# Patient Record
Sex: Female | Born: 1955 | Race: White | Hispanic: No | Marital: Married | Smoking: Former smoker
Health system: Southern US, Community
[De-identification: ages and names within clinical notes are randomized; demographics above are authoritative.]

## PROBLEM LIST (undated history)

## (undated) DIAGNOSIS — N950 Postmenopausal bleeding: Principal | ICD-10-CM

## (undated) DIAGNOSIS — K648 Other hemorrhoids: Secondary | ICD-10-CM

## (undated) DIAGNOSIS — K625 Hemorrhage of anus and rectum: Secondary | ICD-10-CM

## (undated) DIAGNOSIS — Z79890 Hormone replacement therapy (postmenopausal): Secondary | ICD-10-CM

## (undated) DIAGNOSIS — E785 Hyperlipidemia, unspecified: Secondary | ICD-10-CM

## (undated) DIAGNOSIS — F329 Major depressive disorder, single episode, unspecified: Secondary | ICD-10-CM

## (undated) DIAGNOSIS — L739 Follicular disorder, unspecified: Secondary | ICD-10-CM

## (undated) DIAGNOSIS — D126 Benign neoplasm of colon, unspecified: Secondary | ICD-10-CM

## (undated) DIAGNOSIS — F32A Depression, unspecified: Secondary | ICD-10-CM

## (undated) DIAGNOSIS — K589 Irritable bowel syndrome without diarrhea: Secondary | ICD-10-CM

## (undated) HISTORY — DX: Depression, unspecified: F32.A

## (undated) HISTORY — DX: Major depressive disorder, single episode, unspecified: F32.9

## (undated) HISTORY — DX: Follicular disorder, unspecified: L73.9

## (undated) HISTORY — DX: Other hemorrhoids: K64.8

## (undated) HISTORY — PX: HEMORRHOID BANDING: SHX5850

## (undated) HISTORY — DX: Hemorrhage of anus and rectum: K62.5

## (undated) HISTORY — DX: Irritable bowel syndrome, unspecified: K58.9

## (undated) HISTORY — DX: Postmenopausal bleeding: N95.0

## (undated) HISTORY — DX: Benign neoplasm of colon, unspecified: D12.6

## (undated) HISTORY — DX: Hormone replacement therapy: Z79.890

## (undated) HISTORY — PX: EYE SURGERY: SHX253

## (undated) HISTORY — PX: REFRACTIVE SURGERY: SHX103

## (undated) HISTORY — PX: WISDOM TOOTH EXTRACTION: SHX21

## (undated) HISTORY — DX: Hyperlipidemia, unspecified: E78.5

---

## 1991-01-18 HISTORY — PX: TUBAL LIGATION: SHX77

## 2000-09-14 ENCOUNTER — Ambulatory Visit (HOSPITAL_COMMUNITY): Admission: RE | Admit: 2000-09-14 | Discharge: 2000-09-14 | Payer: Self-pay | Admitting: Obstetrics and Gynecology

## 2000-09-14 ENCOUNTER — Encounter: Payer: Self-pay | Admitting: Obstetrics and Gynecology

## 2000-11-07 ENCOUNTER — Other Ambulatory Visit: Admission: RE | Admit: 2000-11-07 | Discharge: 2000-11-07 | Payer: Self-pay | Admitting: Obstetrics and Gynecology

## 2002-02-18 ENCOUNTER — Other Ambulatory Visit: Admission: RE | Admit: 2002-02-18 | Discharge: 2002-02-18 | Payer: Self-pay | Admitting: Obstetrics and Gynecology

## 2002-02-21 ENCOUNTER — Ambulatory Visit (HOSPITAL_COMMUNITY): Admission: RE | Admit: 2002-02-21 | Discharge: 2002-02-21 | Payer: Self-pay | Admitting: Obstetrics and Gynecology

## 2002-02-21 ENCOUNTER — Encounter: Payer: Self-pay | Admitting: Obstetrics and Gynecology

## 2003-04-14 ENCOUNTER — Ambulatory Visit (HOSPITAL_COMMUNITY): Admission: RE | Admit: 2003-04-14 | Discharge: 2003-04-14 | Payer: Self-pay | Admitting: Obstetrics and Gynecology

## 2006-08-10 ENCOUNTER — Ambulatory Visit (HOSPITAL_COMMUNITY): Admission: RE | Admit: 2006-08-10 | Discharge: 2006-08-10 | Payer: Self-pay | Admitting: Obstetrics and Gynecology

## 2006-09-27 ENCOUNTER — Other Ambulatory Visit: Admission: RE | Admit: 2006-09-27 | Discharge: 2006-09-27 | Payer: Self-pay | Admitting: Obstetrics and Gynecology

## 2006-09-28 ENCOUNTER — Ambulatory Visit (HOSPITAL_COMMUNITY): Admission: RE | Admit: 2006-09-28 | Discharge: 2006-09-28 | Payer: Self-pay | Admitting: Obstetrics and Gynecology

## 2006-10-10 ENCOUNTER — Ambulatory Visit (HOSPITAL_COMMUNITY): Admission: RE | Admit: 2006-10-10 | Discharge: 2006-10-10 | Payer: Self-pay | Admitting: Family Medicine

## 2007-08-30 ENCOUNTER — Ambulatory Visit (HOSPITAL_COMMUNITY): Admission: RE | Admit: 2007-08-30 | Discharge: 2007-08-30 | Payer: Self-pay | Admitting: Obstetrics and Gynecology

## 2007-10-19 ENCOUNTER — Other Ambulatory Visit: Admission: RE | Admit: 2007-10-19 | Discharge: 2007-10-19 | Payer: Self-pay | Admitting: Obstetrics and Gynecology

## 2008-07-08 IMAGING — CT CT CHEST W/ CM
1 series · 16 of 33 positions shown, 20 images · IV contrast (Omnipaque 300)
Comparison: none

HISTORY: Abnormal chest x-ray

[Series 2: chestroutine 5.0 b40f · axial · 0.67mm/px · z∈[+690,+976]mm · 16 of 63 slices shown, 20 images]
[im 3/63  mediastinal]
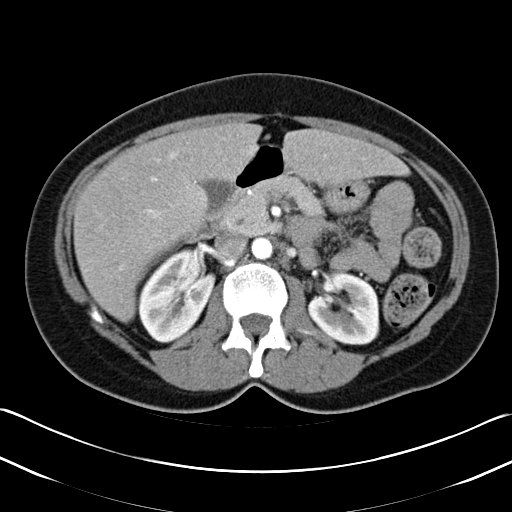
[im 3/63  lung]
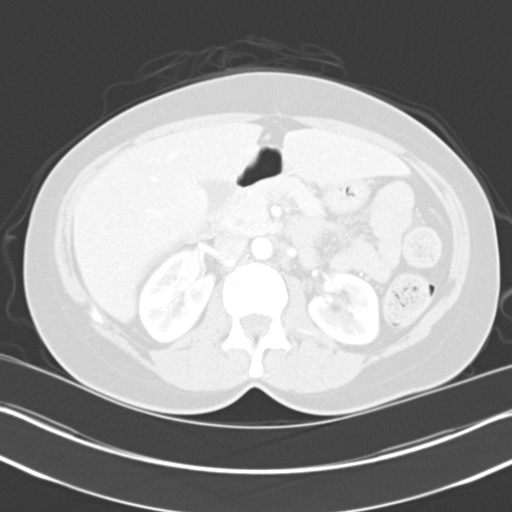
[im 7/63  lung]
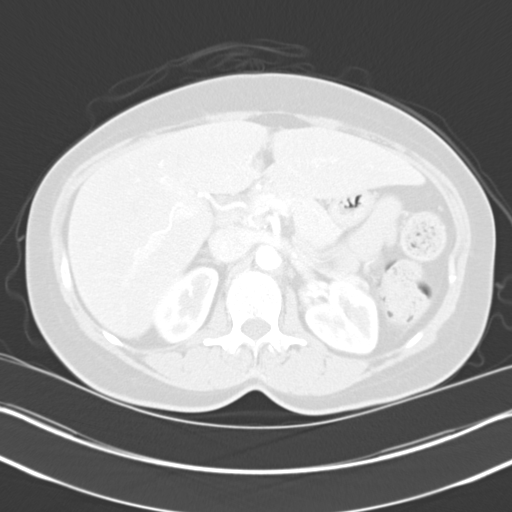
[im 12/63  lung]
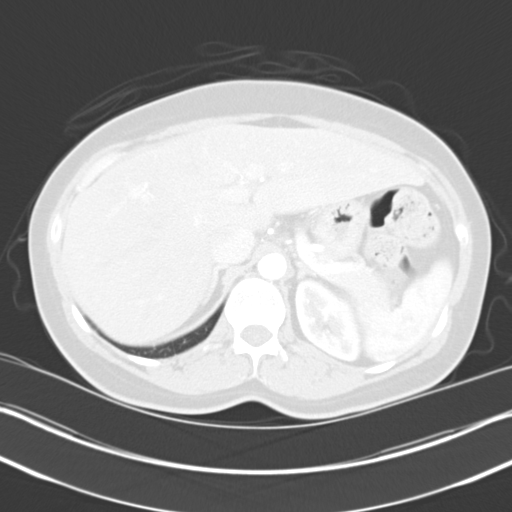
[im 14/63  lung]
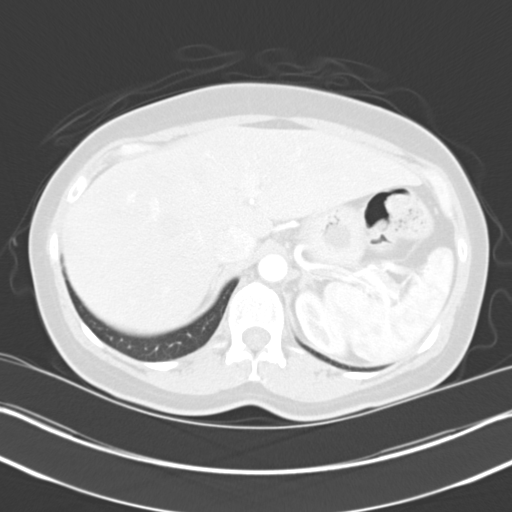
[im 19/63  mediastinal]
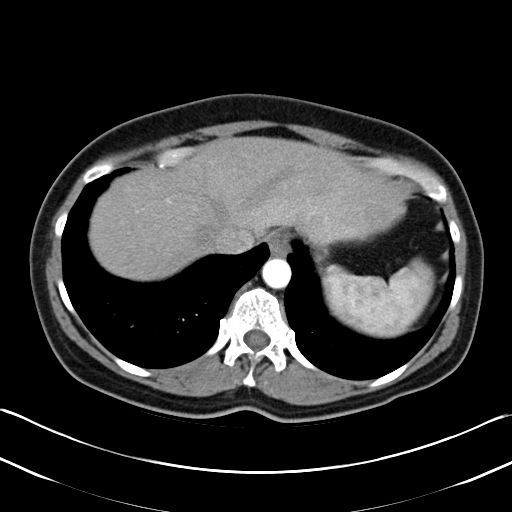
[im 19/63  lung]
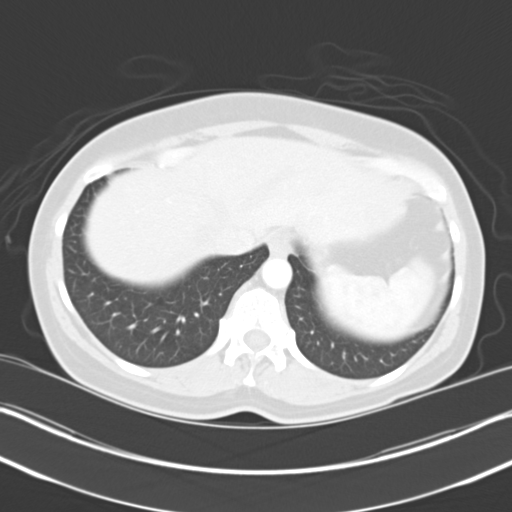
[im 23/63  lung]
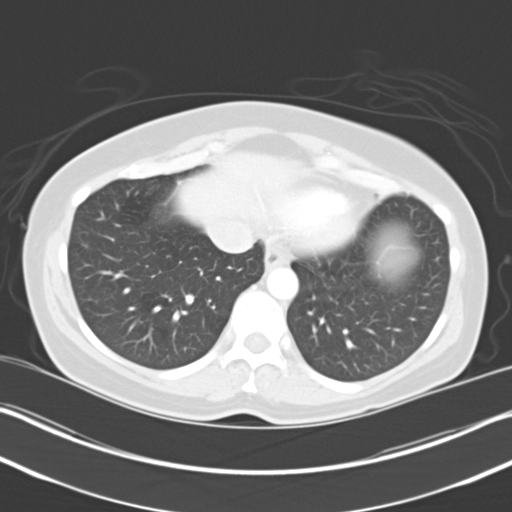
[im 26/63  lung]
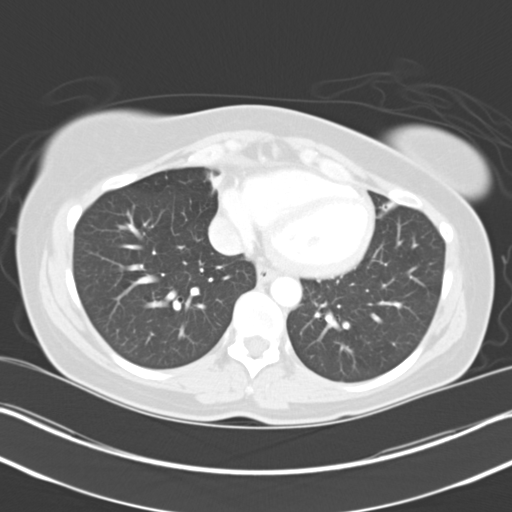
[im 30/63  lung]
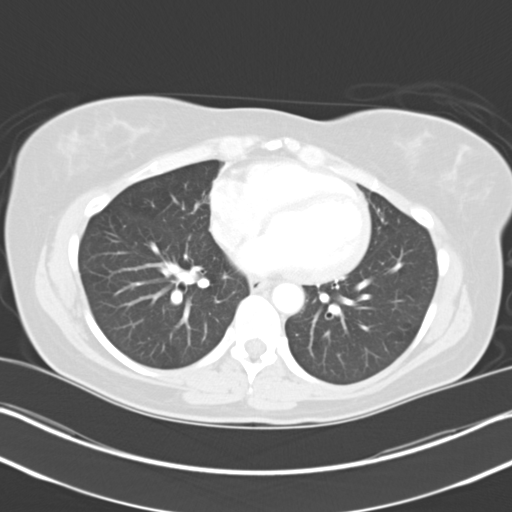
[im 34/63  mediastinal]
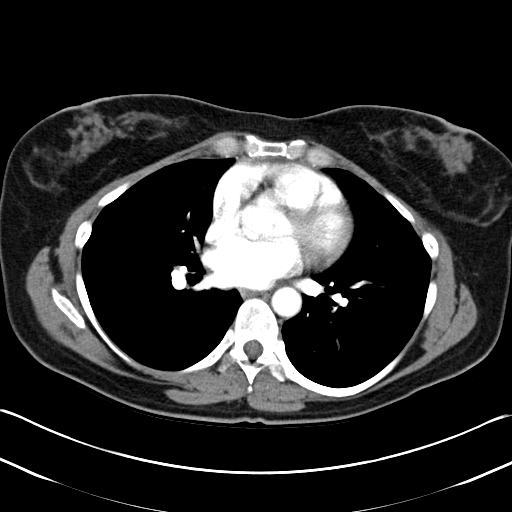
[im 34/63  lung]
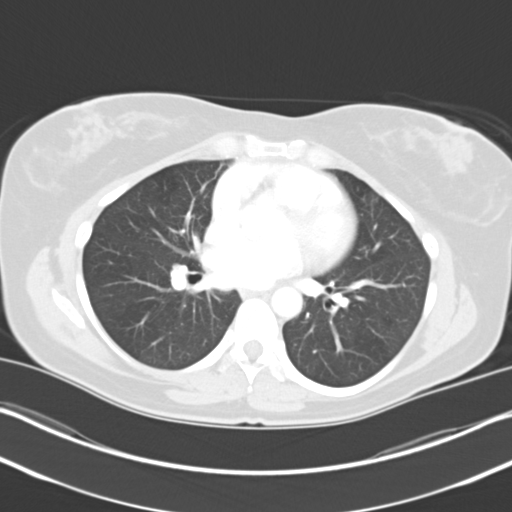
[im 37/63  lung]
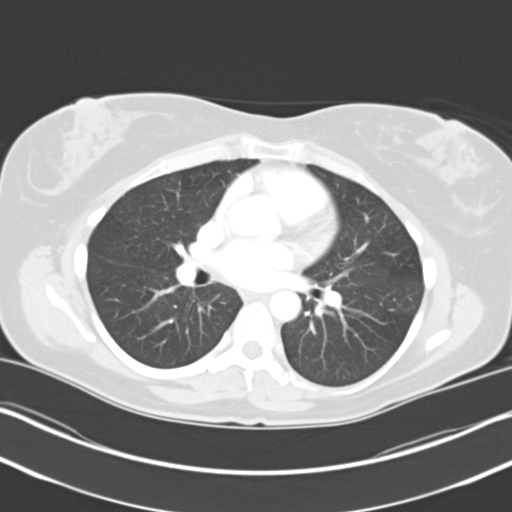
[im 40/63  lung]
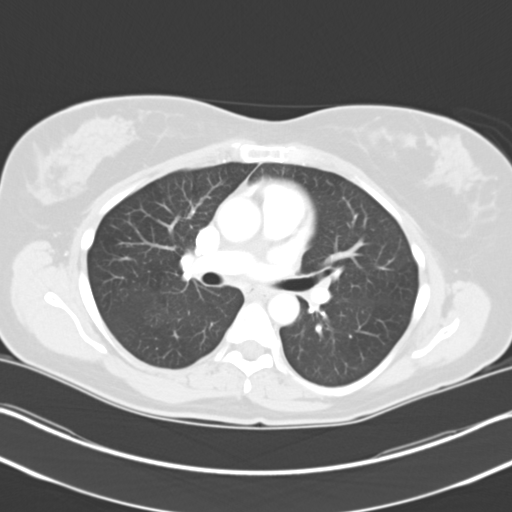
[im 44/63  lung]
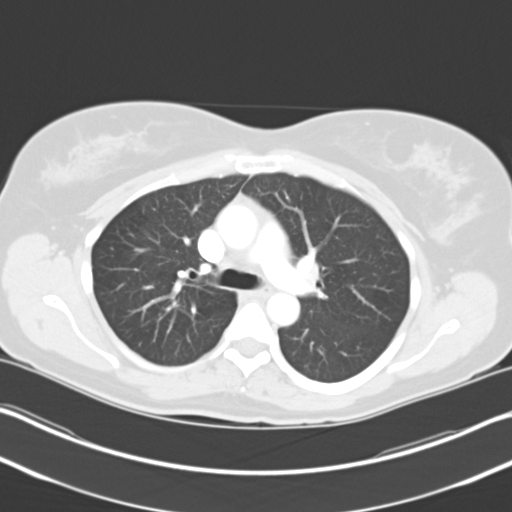
[im 49/63  mediastinal]
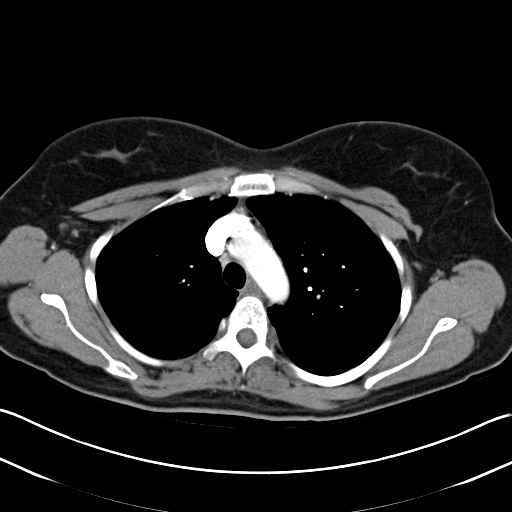
[im 49/63  lung]
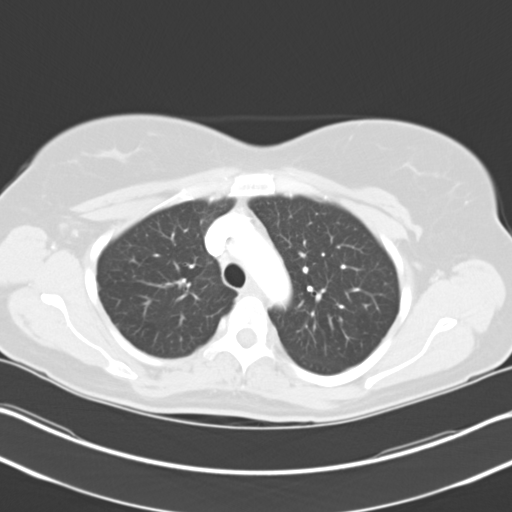
[im 51/63  lung]
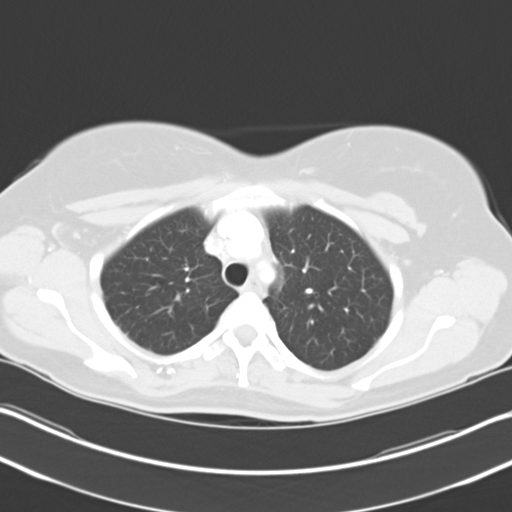
[im 56/63  lung]
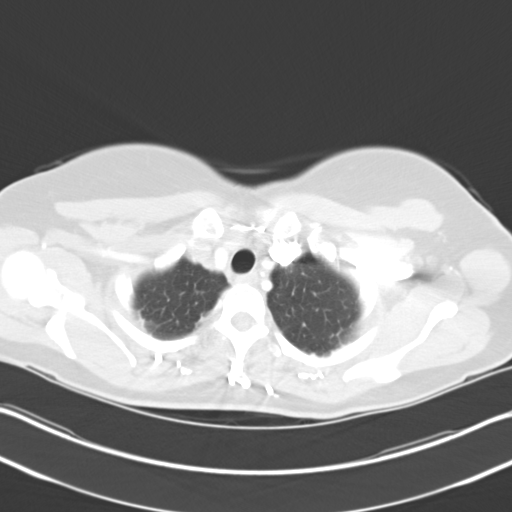
[im 60/63  lung]
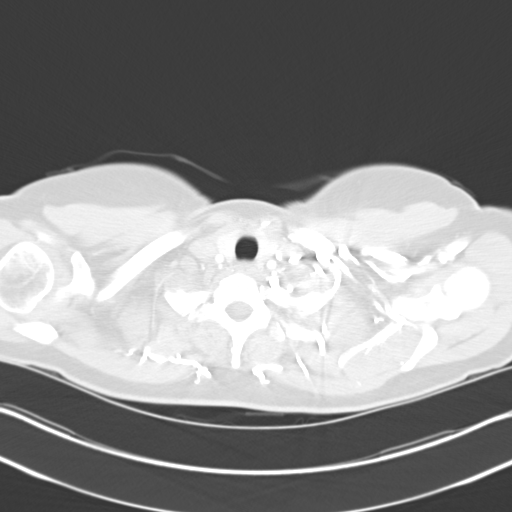

[16 of 33 positions shown; findings below may reference images not displayed]

CT CHEST WITH CONTRAST:

Multidetector helical CT chest performed following 80 cc Smnipaque-CMM.
Sagittal and coronal images reconstructed from axial data set.
Correlation made with priors chest x-ray of 09/28/2006.

Thoracic vascular structures patent.
No thoracic adenopathy.
Visualized portion of upper abdomen unremarkable.
Tiny calcified granuloma in right mid lung, accounting for chest x-ray
abnormality, lesion 3 mm diameter.
No other pulmonary nodule, infiltrate, mass, or effusion.
No bone abnormalities.
IMPRESSION: Tiny calcified granuloma right midlung, accounting for chest x-ray finding.
No acute thoracic abnormalities.

## 2008-09-29 ENCOUNTER — Ambulatory Visit (HOSPITAL_COMMUNITY): Admission: RE | Admit: 2008-09-29 | Discharge: 2008-09-29 | Payer: Self-pay | Admitting: Obstetrics & Gynecology

## 2008-11-10 ENCOUNTER — Other Ambulatory Visit: Admission: RE | Admit: 2008-11-10 | Discharge: 2008-11-10 | Payer: Self-pay | Admitting: Obstetrics and Gynecology

## 2009-10-06 ENCOUNTER — Ambulatory Visit (HOSPITAL_COMMUNITY): Admission: RE | Admit: 2009-10-06 | Discharge: 2009-10-06 | Payer: Self-pay | Admitting: Obstetrics and Gynecology

## 2010-02-07 ENCOUNTER — Encounter: Payer: Self-pay | Admitting: Obstetrics and Gynecology

## 2010-09-24 ENCOUNTER — Other Ambulatory Visit: Payer: Self-pay | Admitting: Adult Health

## 2010-09-24 DIAGNOSIS — Z139 Encounter for screening, unspecified: Secondary | ICD-10-CM

## 2010-10-26 ENCOUNTER — Ambulatory Visit (HOSPITAL_COMMUNITY)
Admission: RE | Admit: 2010-10-26 | Discharge: 2010-10-26 | Disposition: A | Discharge status: Home / Self Care | Payer: PRIVATE HEALTH INSURANCE | Source: Ambulatory Visit | Attending: Adult Health | Admitting: Adult Health

## 2010-10-26 DIAGNOSIS — Z139 Encounter for screening, unspecified: Secondary | ICD-10-CM

## 2010-10-26 DIAGNOSIS — Z1231 Encounter for screening mammogram for malignant neoplasm of breast: Secondary | ICD-10-CM | POA: Insufficient documentation

## 2010-12-14 ENCOUNTER — Other Ambulatory Visit (HOSPITAL_COMMUNITY)
Admission: RE | Admit: 2010-12-14 | Discharge: 2010-12-14 | Disposition: A | Discharge status: Home / Self Care | Payer: PRIVATE HEALTH INSURANCE | Source: Ambulatory Visit | Attending: Obstetrics and Gynecology | Admitting: Obstetrics and Gynecology

## 2010-12-14 ENCOUNTER — Other Ambulatory Visit: Payer: Self-pay | Admitting: Adult Health

## 2010-12-14 DIAGNOSIS — Z01419 Encounter for gynecological examination (general) (routine) without abnormal findings: Secondary | ICD-10-CM | POA: Insufficient documentation

## 2011-09-22 ENCOUNTER — Encounter: Payer: Self-pay | Admitting: Internal Medicine

## 2011-10-18 ENCOUNTER — Other Ambulatory Visit: Payer: Self-pay | Admitting: Adult Health

## 2011-10-18 DIAGNOSIS — Z139 Encounter for screening, unspecified: Secondary | ICD-10-CM

## 2011-10-21 ENCOUNTER — Encounter: Payer: Self-pay | Admitting: Internal Medicine

## 2011-10-21 ENCOUNTER — Ambulatory Visit (AMBULATORY_SURGERY_CENTER): Payer: BC Managed Care – PPO | Admitting: *Deleted

## 2011-10-21 VITALS — Ht 60.0 in | Wt 128.0 lb

## 2011-10-21 DIAGNOSIS — Z1211 Encounter for screening for malignant neoplasm of colon: Secondary | ICD-10-CM

## 2011-10-21 MED ORDER — MOVIPREP 100 G PO SOLR: ORAL | Status: DC

## 2011-11-03 ENCOUNTER — Ambulatory Visit (HOSPITAL_COMMUNITY)
Admission: RE | Admit: 2011-11-03 | Discharge: 2011-11-03 | Disposition: A | Discharge status: Home / Self Care | Payer: BC Managed Care – PPO | Source: Ambulatory Visit | Attending: Adult Health | Admitting: Adult Health

## 2011-11-03 DIAGNOSIS — Z139 Encounter for screening, unspecified: Secondary | ICD-10-CM

## 2011-11-03 DIAGNOSIS — Z1231 Encounter for screening mammogram for malignant neoplasm of breast: Secondary | ICD-10-CM | POA: Insufficient documentation

## 2011-11-04 ENCOUNTER — Ambulatory Visit (AMBULATORY_SURGERY_CENTER): Payer: BC Managed Care – PPO | Admitting: Internal Medicine

## 2011-11-04 ENCOUNTER — Encounter: Payer: Self-pay | Admitting: Internal Medicine

## 2011-11-04 VITALS — BP 111/57 | HR 68 | Temp 98.0°F | Resp 20 | Ht 60.0 in | Wt 128.0 lb

## 2011-11-04 DIAGNOSIS — Z1211 Encounter for screening for malignant neoplasm of colon: Secondary | ICD-10-CM

## 2011-11-04 MED ORDER — SODIUM CHLORIDE 0.9 % IV SOLN: 500.0000 mL | INTRAVENOUS | Status: DC

## 2011-11-04 MED ORDER — HYDROCORTISONE ACETATE 25 MG RE SUPP: 25.0000 mg | Freq: Every evening | RECTAL | Status: DC | PRN

## 2011-11-04 NOTE — Op Note (Signed)
[facility] [address] [place], [address]   COLONOSCOPY PROCEDURE REPORT  PATIENT: Kristen Willis, Kristen Willis  MR#: [id number] BIRTHDATE: 09-05-1955 , 55  yrs. old GENDER: Female ENDOSCOPIST: Hart Carwin, MD REFERRED BY:  Karleen Hampshire, M.D. PROCEDURE DATE:  11/04/2011 PROCEDURE:   Colonoscopy, screening ASA CLASS:   Class I INDICATIONS:average risk patient for colon cancer. MEDICATIONS: MAC sedation, administered by CRNA and Propofol (Diprivan) 200 mg IV  DESCRIPTION OF PROCEDURE:   After the risks and benefits and of the procedure were explained, informed consent was obtained.  A digital rectal exam revealed no abnormalities of the rectum.    The LB PCF-H180AL [id number]  endoscope was introduced through the anus and advanced to the cecum, which was identified by both the appendix and ileocecal valve .  The quality of the prep was excellent, using MoviPrep .  The instrument was then slowly withdrawn as the colon was fully examined.     COLON FINDINGS: Moderate sized internal hemorrhoids were found. Retroflexed views revealed no abnormalities.     The scope was then withdrawn from the patient and the procedure completed.  COMPLICATIONS: There were no complications. ENDOSCOPIC IMPRESSION: Moderate sized internal hemorrhoids Anusol HC supp  RECOMMENDATIONS: High fiber diet   REPEAT EXAM: In 10 year(s)  for Colonoscopy.  cc:  _______________________________ eSignedHart Carwin, MD 11/04/2011 11:35 AM

## 2011-11-04 NOTE — Progress Notes (Signed)
Patient did not experience any of the following events: a burn prior to discharge; a fall within the facility; wrong site/side/patient/procedure/implant event; or a hospital transfer or hospital admission upon discharge from the facility. (G8907) Patient did not have preoperative order for IV antibiotic SSI prophylaxis. (G8918)  

## 2011-11-04 NOTE — Patient Instructions (Addendum)

## 2011-11-07 ENCOUNTER — Telehealth: Payer: Self-pay

## 2011-11-07 NOTE — Telephone Encounter (Signed)
Left message

## 2011-11-07 NOTE — Telephone Encounter (Signed)
  Follow up Call-  Call back number 11/04/2011  Post procedure Call Back phone  # [phone]  Permission to leave phone message Yes     Patient questions:  Do you have a fever, pain , or abdominal swelling? no Pain Score  0 *  Have you tolerated food without any problems? yes  Have you been able to return to your normal activities? yes  Do you have any questions about your discharge instructions: Diet   no Medications  no Follow up visit  no  Do you have questions or concerns about your Care? no  Actions: * If pain score is 4 or above: No action needed, pain <4.

## 2011-12-19 ENCOUNTER — Other Ambulatory Visit: Payer: Self-pay | Admitting: Adult Health

## 2011-12-19 ENCOUNTER — Other Ambulatory Visit (HOSPITAL_COMMUNITY)
Admission: RE | Admit: 2011-12-19 | Discharge: 2011-12-19 | Disposition: A | Discharge status: Home / Self Care | Payer: BC Managed Care – PPO | Source: Ambulatory Visit | Attending: Obstetrics and Gynecology | Admitting: Obstetrics and Gynecology

## 2011-12-19 DIAGNOSIS — Z01419 Encounter for gynecological examination (general) (routine) without abnormal findings: Secondary | ICD-10-CM | POA: Insufficient documentation

## 2011-12-19 DIAGNOSIS — Z1151 Encounter for screening for human papillomavirus (HPV): Secondary | ICD-10-CM | POA: Insufficient documentation

## 2012-06-22 ENCOUNTER — Other Ambulatory Visit: Payer: Self-pay | Admitting: Adult Health

## 2012-10-05 ENCOUNTER — Other Ambulatory Visit: Payer: Self-pay | Admitting: Adult Health

## 2012-10-05 DIAGNOSIS — Z139 Encounter for screening, unspecified: Secondary | ICD-10-CM

## 2012-11-08 ENCOUNTER — Ambulatory Visit (HOSPITAL_COMMUNITY)
Admission: RE | Admit: 2012-11-08 | Discharge: 2012-11-08 | Disposition: A | Discharge status: Home / Self Care | Payer: BC Managed Care – PPO | Source: Ambulatory Visit | Attending: Adult Health | Admitting: Adult Health

## 2012-11-08 DIAGNOSIS — Z1231 Encounter for screening mammogram for malignant neoplasm of breast: Secondary | ICD-10-CM | POA: Insufficient documentation

## 2012-11-08 DIAGNOSIS — Z139 Encounter for screening, unspecified: Secondary | ICD-10-CM

## 2012-12-31 ENCOUNTER — Ambulatory Visit (INDEPENDENT_AMBULATORY_CARE_PROVIDER_SITE_OTHER): Payer: BC Managed Care – PPO | Admitting: Adult Health

## 2012-12-31 ENCOUNTER — Encounter: Payer: Self-pay | Admitting: Adult Health

## 2012-12-31 VITALS — BP 120/78 | HR 72 | Ht 60.0 in | Wt 133.0 lb

## 2012-12-31 DIAGNOSIS — Z1212 Encounter for screening for malignant neoplasm of rectum: Secondary | ICD-10-CM

## 2012-12-31 DIAGNOSIS — F329 Major depressive disorder, single episode, unspecified: Secondary | ICD-10-CM

## 2012-12-31 DIAGNOSIS — Z79890 Hormone replacement therapy (postmenopausal): Secondary | ICD-10-CM | POA: Insufficient documentation

## 2012-12-31 DIAGNOSIS — Z01419 Encounter for gynecological examination (general) (routine) without abnormal findings: Secondary | ICD-10-CM

## 2012-12-31 DIAGNOSIS — M79605 Pain in left leg: Secondary | ICD-10-CM | POA: Insufficient documentation

## 2012-12-31 HISTORY — DX: Hormone replacement therapy: Z79.890

## 2012-12-31 LAB — HEMOCCULT GUIAC POC 1CARD (OFFICE)

## 2012-12-31 MED ORDER — ESTRADIOL-NORETHINDRONE ACET 0.05-0.14 MG/DAY TD PTTW: 1.0000 | MEDICATED_PATCH | TRANSDERMAL | Status: DC

## 2012-12-31 NOTE — Patient Instructions (Signed)
Physical in 1 year Mammogram yearly Colonoscopy per GI Labs in near future

## 2012-12-31 NOTE — Progress Notes (Signed)
Patient ID: Kristen Willis, female   DOB: Jan 18, 1955, 57 y.o.   MRN: [id number] History of Present Illness: Lexi is a 57 year old white female, married in for a physical,had a normal pap with negative HPV 12/19/11.   Current Medications, Allergies, Past Medical History, Past Surgical History, Family History and Social History were reviewed in [employer] record.   Past Medical History  Diagnosis Date  . Depression   . Depression   . Irritable bowel syndrome   . Hormone replacement therapy (HRT) 12/31/2012   Past Surgical History  Procedure Laterality Date  . Tubal ligation  1993  . Wisdom tooth extraction      early 20's  Current outpatient prescriptions:buPROPion (WELLBUTRIN XL) 300 MG 24 hr tablet, Take 150 mg by mouth Daily., Disp: , Rfl: ;  Calcium Carb-Cholecalciferol (CALCIUM + D3) 600-200 MG-UNIT TABS, Take 1 tablet by mouth daily., Disp: , Rfl: ;  estradiol-norethindrone (COMBIPATCH) 0.05-0.14 MG/DAY, Place 1 patch onto the skin 2 (two) times a week., Disp: 8 patch, Rfl: prn hydrocortisone (ANUSOL-HC) 25 MG suppository, Place 1 suppository (25 mg total) rectally at bedtime as needed for hemorrhoids., Disp: 12 suppository, Rfl: 1;  Inulin (FIBER-SURE PO), Take by mouth as directed., Disp: , Rfl: ;  medroxyPROGESTERone (PROVERA) 10 MG tablet, TAKE ONE TABLET DAILY., Disp: 30 tablet, Rfl: 6;  vitamin C (ASCORBIC ACID) 500 MG tablet, Take 500 mg by mouth daily., Disp: , Rfl:  vitamin E 400 UNIT capsule, Take 400 Units by mouth daily., Disp: , Rfl: ;  RASPBERRY PO, Take 1 tablet by mouth daily., Disp: , Rfl:   Review of Systems: Patient denies any headaches, blurred vision, shortness of breath, chest pain, abdominal pain, problems with bowel movements, urination, or intercourse. No mood swings, complains of pain left leg, no point tenderness.    Physical Exam:BP 120/78  Pulse 72  Ht 5' (1.524 m)  Wt 133 lb (60.328 kg)  BMI 25.97 kg/m2  LMP  09/21/2011 General:  Well developed, well nourished, no acute distress Skin:  Warm and dry Neck:  Midline trachea, normal thyroid Lungs; Clear to auscultation bilaterally Breast:  No dominant palpable mass, retraction, or nipple discharge Cardiovascular: Regular rate and rhythm Abdomen:  Soft, non tender, no hepatosplenomegaly Pelvic:  External genitalia is normal in appearance.  The vagina is normal in appearance. The cervix is smooth.  Uterus is felt to be normal size, shape, and contour.  No  adnexal masses or tenderness noted. Rectal: Good sphincter tone, no polyps, or hemorrhoids felt.  Hemoccult negative. Extremities:  No swelling or varicosities noted, no point tenderness Psych:  No mood changes, alert and cooperative,seems happy   Impression: Yearly well woman exam no pap HRT Left leg pain Depression     Plan: Physical in 1 year  Mammogram yearly  Colonoscopy per GI Labs in near future Refilled climara patch x 1 year See Dr Romeo Apple

## 2013-01-28 ENCOUNTER — Telehealth: Payer: Self-pay | Admitting: Adult Health

## 2013-01-28 NOTE — Telephone Encounter (Signed)
Having rectal bleeding x 3,  had colonoscopy 2013 Dr Olevia Perches, will make appt to be checked

## 2013-01-30 ENCOUNTER — Encounter: Payer: Self-pay | Admitting: Adult Health

## 2013-01-30 ENCOUNTER — Ambulatory Visit (INDEPENDENT_AMBULATORY_CARE_PROVIDER_SITE_OTHER): Payer: 59 | Admitting: Adult Health

## 2013-01-30 ENCOUNTER — Encounter (INDEPENDENT_AMBULATORY_CARE_PROVIDER_SITE_OTHER): Payer: Self-pay

## 2013-01-30 VITALS — BP 122/74 | Ht 60.0 in | Wt 137.0 lb

## 2013-01-30 DIAGNOSIS — K625 Hemorrhage of anus and rectum: Secondary | ICD-10-CM

## 2013-01-30 DIAGNOSIS — K649 Unspecified hemorrhoids: Secondary | ICD-10-CM | POA: Insufficient documentation

## 2013-01-30 HISTORY — DX: Hemorrhage of anus and rectum: K62.5

## 2013-01-30 LAB — HEMOCCULT GUIAC POC 1CARD (OFFICE): Fecal Occult Blood, POC: NEGATIVE

## 2013-01-30 MED ORDER — HYDROCORTISONE ACETATE 25 MG RE SUPP: RECTAL | Status: DC

## 2013-01-30 NOTE — Patient Instructions (Signed)
Rectal Bleeding Rectal bleeding is when blood passes out of the anus. It is usually a sign that something is wrong. It may not be serious, but it should always be evaluated. Rectal bleeding may present as bright red blood or extremely dark stools. The color may range from dark red or maroon to black (like tar). It is important that the cause of rectal bleeding be identified so treatment can be started and the problem corrected. CAUSES   Hemorrhoids. These are enlarged (dilated) blood vessels or veins in the anal or rectal area.  Fistulas. Theseare abnormal, burrowing channels that usually run from inside the rectum to the skin around the anus. They can bleed.  Anal fissures. This is a tear in the tissue of the anus. Bleeding occurs with bowel movements.  Diverticulosis. This is a condition in which pockets or sacs project from the bowel wall. Occasionally, the sacs can bleed.  Diverticulitis. Thisis an infection involving diverticulosis of the colon.  Proctitis and colitis. These are conditions in which the rectum, colon, or both, can become inflamed and pitted (ulcerated).  Polyps and cancer. Polyps are non-cancerous (benign) growths in the colon that may bleed. Certain types of polyps turn into cancer.  Protrusion of the rectum. Part of the rectum can project from the anus and bleed.  Certain medicines.  Intestinal infections.  Blood vessel abnormalities. HOME CARE INSTRUCTIONS  Eat a high-fiber diet to keep your stool soft.  Limit activity.  Drink enough fluids to keep your urine clear or pale yellow.  Warm baths may be useful to soothe rectal pain.  Follow up with your caregiver as directed. SEEK IMMEDIATE MEDICAL CARE IF:  You develop increased bleeding.  You have black or dark red stools.  You vomit blood or material that looks like coffee grounds.  You have abdominal pain or tenderness.  You have a fever.  You feel weak, nauseous, or you faint.  You have  severe rectal pain or you are unable to have a bowel movement. MAKE SURE YOU:  Understand these instructions.  Will watch your condition.  Will get help right away if you are not doing well or get worse. Document Released: 06/25/2001 Document Revised: 03/28/2011 Document Reviewed: 06/20/2010 [facility] Patient Information 2014 [place], [place]. Hemorrhoids Hemorrhoids are swollen veins around the rectum or anus. There are two types of hemorrhoids:   Internal hemorrhoids. These occur in the veins just inside the rectum. They may poke through to the outside and become irritated and painful.  External hemorrhoids. These occur in the veins outside the anus and can be felt as a painful swelling or hard lump near the anus. CAUSES  Pregnancy.   Obesity.   Constipation or diarrhea.   Straining to have a bowel movement.   Sitting for long periods on the toilet.  Heavy lifting or other activity that caused you to strain.  Anal intercourse. SYMPTOMS   Pain.   Anal itching or irritation.   Rectal bleeding.   Fecal leakage.   Anal swelling.   One or more lumps around the anus.  DIAGNOSIS  Your caregiver may be able to diagnose hemorrhoids by visual examination. Other examinations or tests that may be performed include:   Examination of the rectal area with a gloved hand (digital rectal exam).   Examination of anal canal using a small tube (scope).   A blood test if you have lost a significant amount of blood.  A test to look inside the colon (sigmoidoscopy or colonoscopy). TREATMENT  Most hemorrhoids can be treated at home. However, if symptoms do not seem to be getting better or if you have a lot of rectal bleeding, your caregiver may perform a procedure to help make the hemorrhoids get smaller or remove them completely. Possible treatments include:   Placing a rubber band at the base of the hemorrhoid to cut off the circulation (rubber band ligation).    Injecting a chemical to shrink the hemorrhoid (sclerotherapy).   Using a tool to burn the hemorrhoid (infrared light therapy).   Surgically removing the hemorrhoid (hemorrhoidectomy).   Stapling the hemorrhoid to block blood flow to the tissue (hemorrhoid stapling).  HOME CARE INSTRUCTIONS   Eat foods with fiber, such as whole grains, beans, nuts, fruits, and vegetables. Ask your doctor about taking products with added fiber in them (fibersupplements).  Increase fluid intake. Drink enough water and fluids to keep your urine clear or pale yellow.   Exercise regularly.   Go to the bathroom when you have the urge to have a bowel movement. Do not wait.   Avoid straining to have bowel movements.   Keep the anal area dry and clean. Use wet toilet paper or moist towelettes after a bowel movement.   Medicated creams and suppositories may be used or applied as directed.   Only take over-the-counter or prescription medicines as directed by your caregiver.   Take warm sitz baths for 15 20 minutes, 3 4 times a day to ease pain and discomfort.   Place ice packs on the hemorrhoids if they are tender and swollen. Using ice packs between sitz baths may be helpful.   Put ice in a plastic bag.   Place a towel between your skin and the bag.   Leave the ice on for 15 20 minutes, 3 4 times a day.   Do not use a donut-shaped pillow or sit on the toilet for long periods. This increases blood pooling and pain.  SEEK MEDICAL CARE IF:  You have increasing pain and swelling that is not controlled by treatment or medicine.  You have uncontrolled bleeding.  You have difficulty or you are unable to have a bowel movement.  You have pain or inflammation outside the area of the hemorrhoids. MAKE SURE YOU:  Understand these instructions.  Will watch your condition.  Will get help right away if you are not doing well or get worse. Document Released: 01/01/2000 Document  Revised: 12/21/2011 Document Reviewed: 11/08/2011 [facility] Patient Information 2014 [place]. Use supp  Warm tub increase fiber and water Call prn

## 2013-01-30 NOTE — Progress Notes (Signed)
Subjective:     Patient ID: Kristen Willis, female   DOB: 1955-06-28, 58 y.o.   MRN: [id number]  HPI Kristen Willis is a 58 year old white female in complaining of having 3 episodes of painless rectal bleeding,has had none since Monday, but no BM either.She had a normal colonoscopy in 2013 in [place] with Dr Maryanna Shape.She has known hemorrhoids.  Review of Systems See HPI Reviewed past medical,surgical, social and family history. Reviewed medications and allergies.     Objective:   Physical Exam BP 122/74  Ht 5' (1.524 m)  Wt 137 lb (62.143 kg)  BMI 26.76 kg/m2  LMP 09/21/2011   Skin warm and dry, on rectal exam has small external hemorrhoid and internal one at 5 0'clock and the hemoccult was negative.  Assessment:     Rectal bleeding Hemorrhoids     Plan:     Refilled Anusol HC supp 25 mg use 1 bid for about 5 days then prn #12 with 1 refill Increase fiber and water Review handout on rectal bleeding and hemorrhoids and call me with any more problems or if decides wants to see Dr Oneida Alar for banding or call Dr Maryanna Shape in [place] for follow up

## 2013-02-13 ENCOUNTER — Telehealth: Payer: Self-pay | Admitting: Obstetrics and Gynecology

## 2013-02-13 ENCOUNTER — Encounter: Payer: Self-pay | Admitting: Adult Health

## 2013-02-13 NOTE — Telephone Encounter (Signed)
Pt to call our office back to clarify if we need to do a mail in order or can e-scribe RX to her pharmacy.

## 2013-02-18 ENCOUNTER — Other Ambulatory Visit: Payer: Self-pay | Admitting: Adult Health

## 2013-04-26 NOTE — Telephone Encounter (Signed)
close

## 2013-05-09 ENCOUNTER — Other Ambulatory Visit: Payer: Self-pay | Admitting: *Deleted

## 2013-05-09 MED ORDER — MEDROXYPROGESTERONE ACETATE 10 MG PO TABS: 10.0000 mg | ORAL_TABLET | Freq: Every day | ORAL | Status: DC

## 2013-10-22 ENCOUNTER — Other Ambulatory Visit: Payer: Self-pay | Admitting: Adult Health

## 2013-10-22 DIAGNOSIS — Z139 Encounter for screening, unspecified: Secondary | ICD-10-CM

## 2013-11-11 ENCOUNTER — Ambulatory Visit (HOSPITAL_COMMUNITY)
Admission: RE | Admit: 2013-11-11 | Discharge: 2013-11-11 | Disposition: A | Discharge status: Home / Self Care | Payer: 59 | Source: Ambulatory Visit | Attending: Adult Health | Admitting: Adult Health

## 2013-11-11 DIAGNOSIS — Z1231 Encounter for screening mammogram for malignant neoplasm of breast: Secondary | ICD-10-CM | POA: Insufficient documentation

## 2013-11-11 DIAGNOSIS — Z139 Encounter for screening, unspecified: Secondary | ICD-10-CM

## 2013-11-18 ENCOUNTER — Encounter: Payer: Self-pay | Admitting: Adult Health

## 2014-01-01 ENCOUNTER — Encounter: Payer: Self-pay | Admitting: Adult Health

## 2014-01-01 ENCOUNTER — Ambulatory Visit (INDEPENDENT_AMBULATORY_CARE_PROVIDER_SITE_OTHER): Payer: 59 | Admitting: Adult Health

## 2014-01-01 VITALS — BP 120/72 | HR 76 | Ht 59.5 in | Wt 135.0 lb

## 2014-01-01 DIAGNOSIS — Z79890 Hormone replacement therapy (postmenopausal): Secondary | ICD-10-CM

## 2014-01-01 DIAGNOSIS — Z01419 Encounter for gynecological examination (general) (routine) without abnormal findings: Secondary | ICD-10-CM

## 2014-01-01 MED ORDER — ESTRADIOL-NORETHINDRONE ACET 0.05-0.14 MG/DAY TD PTTW: 1.0000 | MEDICATED_PATCH | TRANSDERMAL | Status: DC

## 2014-01-01 NOTE — Progress Notes (Signed)
Patient ID: Kristen Willis, female   DOB: [birth year], 58 y.o.   MRN: [id number] History of Present Illness: Kristen Willis is a 58 year old white female, married in for gyn exam .She had a normal pap with negative HPV 12/19/11.   Current Medications, Allergies, Past Medical History, Past Surgical History, Family History and Social History were reviewed in [employer] record.     Review of Systems: Patient denies any headaches, blurred vision, shortness of breath, chest pain, abdominal pain, problems with bowel movements, urination, or intercourse. No joint swelling or mood swings, occasional fluid retention.    Physical Exam:BP 120/72 mmHg  Pulse 76  Ht 4' 11.5" (1.511 m)  Wt 135 lb (61.236 kg)  BMI 26.82 kg/m2  LMP 09/21/2011 General:  Well developed, well nourished, no acute distress Skin:  Warm and dry Neck:  Midline trachea, normal thyroid Lungs; Clear to auscultation bilaterally Breast:  No dominant palpable mass, retraction, or nipple discharge Cardiovascular: Regular rate and rhythm Abdomen:  Soft, non tender, no hepatosplenomegaly Pelvic:  External genitalia is normal in appearance.  The vagina is normal in appearance.  The cervix is smooth.  Uterus is felt to be normal size, shape, and contour.  No adnexal masses or tenderness noted. Rectal: Good sphincter tone, no polyps, or hemorrhoids felt.  Hemoccult negative. Extremities:  No swelling or varicosities noted today Psych:  No mood changes,alert and cooperative,seems happy   Impression: Well woman gyn exam no pap HRT    Plan: Refilled Combipatch x 1 year Mammogram yearly  Colonoscopy per GI Labs in near future,CBC,CMP,TSH and lipids Pap and physical in 1 year

## 2014-01-01 NOTE — Patient Instructions (Signed)
Pap and physical in  1 year Mammogram yearly  Labs in near future Colonoscopy per GI

## 2014-02-11 ENCOUNTER — Other Ambulatory Visit: Payer: Self-pay | Admitting: *Deleted

## 2014-02-11 MED ORDER — ESTRADIOL-NORETHINDRONE ACET 0.05-0.14 MG/DAY TD PTTW: 1.0000 | MEDICATED_PATCH | TRANSDERMAL | Status: DC

## 2014-04-04 ENCOUNTER — Other Ambulatory Visit: Payer: Self-pay | Admitting: Adult Health

## 2014-09-26 ENCOUNTER — Other Ambulatory Visit: Payer: Self-pay | Admitting: Adult Health

## 2014-10-22 ENCOUNTER — Telehealth: Payer: Self-pay | Admitting: Adult Health

## 2014-10-22 MED ORDER — SULFAMETHOXAZOLE-TRIMETHOPRIM 800-160 MG PO TABS: 1.0000 | ORAL_TABLET | Freq: Two times a day (BID) | ORAL | Status: DC

## 2014-10-22 NOTE — Telephone Encounter (Signed)
Has spot in groin, is purple and tender,to come in Monday at her request, will RX septra ds 1 bid x 14 days to calm it,has some spotting on HRT too

## 2014-10-27 ENCOUNTER — Ambulatory Visit (INDEPENDENT_AMBULATORY_CARE_PROVIDER_SITE_OTHER): Payer: 59 | Admitting: Adult Health

## 2014-10-27 ENCOUNTER — Encounter: Payer: Self-pay | Admitting: Adult Health

## 2014-10-27 VITALS — BP 116/78 | HR 72 | Ht 60.0 in | Wt 133.0 lb

## 2014-10-27 DIAGNOSIS — Z79890 Hormone replacement therapy (postmenopausal): Secondary | ICD-10-CM | POA: Diagnosis not present

## 2014-10-27 DIAGNOSIS — L739 Follicular disorder, unspecified: Secondary | ICD-10-CM | POA: Diagnosis not present

## 2014-10-27 DIAGNOSIS — N950 Postmenopausal bleeding: Secondary | ICD-10-CM

## 2014-10-27 HISTORY — DX: Postmenopausal bleeding: N95.0

## 2014-10-27 HISTORY — DX: Follicular disorder, unspecified: L73.9

## 2014-10-27 NOTE — Progress Notes (Signed)
Subjective:     Patient ID: Kristen Willis, female   DOB: [birth year], 59 y.o.   MRN: [id number]  HPI Kristen Willis is a 59 year old white female,married in complaining of bleeding some on HRT and has bump on bottom,is better since starting septra ds.  Review of Systems Patient denies any headaches, hearing loss, fatigue, blurred vision, shortness of breath, chest pain, abdominal pain, problems with bowel movements, urination, or intercourse. No joint pain or mood swings.See HPI for positives.  Reviewed past medical,surgical, social and family history. Reviewed medications and allergies.     Objective:   Physical Exam BP 116/78 mmHg  Pulse 72  Ht 5' (1.524 m)  Wt 133 lb (60.328 kg)  BMI 25.97 kg/m2  LMP 09/21/2011 Skin warm and dry.Pelvic: external genitalia is normal in appearance has fading purple area near base right labia,peas sized area of firmness there, vagina: scant discharge without odor,urethra has no lesions or masses noted, cervix:smooth and bulbous, uterus: normal size, shape and contour, non tender, no masses felt, adnexa: no masses or tenderness noted. Bladder is non tender and no masses felt.     Assessment:     PMB Folliculitis HRT    Plan:     Return in 1 week for gyn [place] Finish septra ds Continue HRT

## 2014-10-27 NOTE — Patient Instructions (Signed)
Return in 1 week for [place] Continue septra ds

## 2014-11-03 ENCOUNTER — Ambulatory Visit (INDEPENDENT_AMBULATORY_CARE_PROVIDER_SITE_OTHER): Payer: 59

## 2014-11-03 DIAGNOSIS — N950 Postmenopausal bleeding: Secondary | ICD-10-CM

## 2014-11-03 NOTE — Progress Notes (Signed)
PELVIC US TA/TV: normal retroverted uterus,EEC 2.76mm,normal ov bilat (mobile),no free fluid,no pain during ultrasound

## 2014-11-04 ENCOUNTER — Telehealth: Payer: Self-pay | Admitting: Adult Health

## 2014-11-04 NOTE — Telephone Encounter (Signed)
Pt aware [place] was normal,EEC 2.2 mm

## 2014-11-18 ENCOUNTER — Other Ambulatory Visit: Payer: Self-pay | Admitting: Adult Health

## 2014-11-18 DIAGNOSIS — Z1231 Encounter for screening mammogram for malignant neoplasm of breast: Secondary | ICD-10-CM

## 2014-11-20 ENCOUNTER — Ambulatory Visit (HOSPITAL_COMMUNITY)
Admission: RE | Admit: 2014-11-20 | Discharge: 2014-11-20 | Disposition: A | Discharge status: Home / Self Care | Payer: 59 | Source: Ambulatory Visit | Attending: Adult Health | Admitting: Adult Health

## 2014-11-20 DIAGNOSIS — Z1231 Encounter for screening mammogram for malignant neoplasm of breast: Secondary | ICD-10-CM | POA: Diagnosis not present

## 2015-01-21 ENCOUNTER — Telehealth: Payer: Self-pay | Admitting: Adult Health

## 2015-01-21 MED ORDER — ESTRADIOL-NORETHINDRONE ACET 0.05-0.14 MG/DAY TD PTTW: 1.0000 | MEDICATED_PATCH | TRANSDERMAL | Status: DC

## 2015-01-21 NOTE — Telephone Encounter (Signed)
Will order combi patch at [facility].can't get through mail order any more

## 2015-03-09 ENCOUNTER — Telehealth: Payer: Self-pay | Admitting: Adult Health

## 2015-03-09 NOTE — Telephone Encounter (Signed)
Left message I called 

## 2015-03-09 NOTE — Telephone Encounter (Signed)
Pt called stating that she would like to speak with Jennifer, Pt did not state the reason why. Please contact pt °

## 2015-03-09 NOTE — Telephone Encounter (Signed)
Had some more vaginal bleeding on HRT, had normal [place] in October, will stop HRT and make appt for pap and physical in about 3 weeks

## 2015-04-01 ENCOUNTER — Other Ambulatory Visit (HOSPITAL_COMMUNITY)
Admission: RE | Admit: 2015-04-01 | Discharge: 2015-04-01 | Disposition: A | Discharge status: Home / Self Care | Payer: 59 | Source: Ambulatory Visit | Attending: Adult Health | Admitting: Adult Health

## 2015-04-01 ENCOUNTER — Ambulatory Visit (INDEPENDENT_AMBULATORY_CARE_PROVIDER_SITE_OTHER): Payer: 59 | Admitting: Adult Health

## 2015-04-01 ENCOUNTER — Encounter: Payer: Self-pay | Admitting: Adult Health

## 2015-04-01 VITALS — BP 136/70 | HR 84 | Ht 59.25 in | Wt 133.0 lb

## 2015-04-01 DIAGNOSIS — Z1151 Encounter for screening for human papillomavirus (HPV): Secondary | ICD-10-CM | POA: Diagnosis present

## 2015-04-01 DIAGNOSIS — Z01419 Encounter for gynecological examination (general) (routine) without abnormal findings: Secondary | ICD-10-CM | POA: Insufficient documentation

## 2015-04-01 DIAGNOSIS — Z1212 Encounter for screening for malignant neoplasm of rectum: Secondary | ICD-10-CM | POA: Diagnosis not present

## 2015-04-01 DIAGNOSIS — R3915 Urgency of urination: Secondary | ICD-10-CM

## 2015-04-01 LAB — POCT URINALYSIS DIPSTICK
Glucose, UA: NEGATIVE
LEUKOCYTES UA: NEGATIVE
NITRITE UA: NEGATIVE
PROTEIN UA: NEGATIVE
RBC UA: NEGATIVE

## 2015-04-01 LAB — HEMOCCULT GUIAC POC 1CARD (OFFICE): Fecal Occult Blood, POC: NEGATIVE

## 2015-04-01 NOTE — Progress Notes (Signed)
Patient ID: MAGNOLIA KOWITZ, female   DOB: 12-18-1955, 60 y.o.   MRN: [id number] History of Present Illness: Kristn is a 60 year old white female, married in for well woman gyn exam and pap.She has stopped HRT and has not had any spotting in over a month.She has some urinary urgency at night.Has had a cough for about 3 weeks.   Current Medications, Allergies, Past Medical History, Past Surgical History, Family History and Social History were reviewed in [employer] record.     Review of Systems: Patient denies any headaches, hearing loss, fatigue, blurred vision, shortness of breath, chest pain, abdominal pain, problems with bowel movements, or intercourse. No joint pain or mood swings. See HPI for positives.   Physical Exam:BP 136/70 mmHg  Pulse 84  Ht 4' 11.25" (1.505 m)  Wt 133 lb (60.328 kg)  BMI 26.63 kg/m2  LMP 09/21/2011 urine negtive General:  Well developed, well nourished, no acute distress Skin:  Warm and dry Neck:  Midline trachea, normal thyroid, good ROM, no lymphadenopathy Lungs; Clear to auscultation bilaterally Breast:  No dominant palpable mass, retraction, or nipple discharge Cardiovascular: Regular rate and rhythm Abdomen:  Soft, non tender, no hepatosplenomegaly Pelvic:  External genitalia is normal in appearance, no lesions.  The vagina is normal in appearance. Urethra has no lesions or masses. The cervix is smooth, pap with HPV performed.  Uterus is felt to be normal size, shape, and contour.  No adnexal masses or tenderness noted.Bladder is non tender, no masses felt. Rectal: Good sphincter tone, no polyps, or hemorrhoids felt.  Hemoccult negative. Extremities/musculoskeletal:  No swelling or varicosities noted, no clubbing or cyanosis Psych:  No mood changes, alert and cooperative,seems happy   Impression: Well woman gyn exam and pap Urinary urgency     Plan: Check CBC,CMP,TSH and lipids,A1c and vitamin D in near future, orders  given  Physical in 1 year, pap in 3 if normal Mammogram yearly

## 2015-04-01 NOTE — Patient Instructions (Signed)
Labs soon Physical in 1 year, pap in 3 if normal Mammogram yearly

## 2015-04-03 LAB — CYTOLOGY - PAP

## 2015-05-09 LAB — CBC
HEMATOCRIT: 44.9 % (ref 34.0–46.6)
HEMOGLOBIN: 15.4 g/dL (ref 11.1–15.9)
MCH: 32.4 pg (ref 26.6–33.0)
MCHC: 34.3 g/dL (ref 31.5–35.7)
MCV: 95 fL (ref 79–97)
Platelets: 238 10*3/uL (ref 150–379)
RBC: 4.75 x10E6/uL (ref 3.77–5.28)
RDW: 12.9 % (ref 12.3–15.4)
WBC: 4.8 10*3/uL (ref 3.4–10.8)

## 2015-05-09 LAB — COMPREHENSIVE METABOLIC PANEL
A/G RATIO: 2.1 (ref 1.2–2.2)
ALBUMIN: 4.5 g/dL (ref 3.5–5.5)
ALK PHOS: 65 IU/L (ref 39–117)
ALT: 40 IU/L — AB (ref 0–32)
AST: 24 IU/L (ref 0–40)
BUN/Creatinine Ratio: 18 (ref 9–23)
BUN: 14 mg/dL (ref 6–24)
Bilirubin Total: 0.7 mg/dL (ref 0.0–1.2)
CHLORIDE: 101 mmol/L (ref 96–106)
CO2: 24 mmol/L (ref 18–29)
CREATININE: 0.79 mg/dL (ref 0.57–1.00)
Calcium: 9.4 mg/dL (ref 8.7–10.2)
GFR calc Af Amer: 96 mL/min/{1.73_m2} (ref >59)
GFR, EST NON AFRICAN AMERICAN: 83 mL/min/{1.73_m2} (ref >59)
GLOBULIN, TOTAL: 2.1 g/dL (ref 1.5–4.5)
Glucose: 105 mg/dL — ABNORMAL HIGH (ref 65–99)
POTASSIUM: 5 mmol/L (ref 3.5–5.2)
SODIUM: 143 mmol/L (ref 134–144)
Total Protein: 6.6 g/dL (ref 6.0–8.5)

## 2015-05-09 LAB — VITAMIN D 25 HYDROXY (VIT D DEFICIENCY, FRACTURES): VIT D 25 HYDROXY: 30.4 ng/mL (ref 30.0–100.0)

## 2015-05-09 LAB — HEMOGLOBIN A1C
ESTIMATED AVERAGE GLUCOSE: 117 mg/dL
HEMOGLOBIN A1C: 5.7 % — AB (ref 4.8–5.6)

## 2015-05-09 LAB — LIPID PANEL
Chol/HDL Ratio: 3.8 ratio units (ref 0.0–4.4)
Cholesterol, Total: 201 mg/dL — ABNORMAL HIGH (ref 100–199)
HDL: 53 mg/dL (ref >39)
LDL Calculated: 121 mg/dL — ABNORMAL HIGH (ref 0–99)
Triglycerides: 136 mg/dL (ref 0–149)
VLDL Cholesterol Cal: 27 mg/dL (ref 5–40)

## 2015-05-09 LAB — TSH: TSH: 1.24 u[IU]/mL (ref 0.450–4.500)

## 2015-05-11 ENCOUNTER — Telehealth: Payer: Self-pay | Admitting: Adult Health

## 2015-05-11 NOTE — Telephone Encounter (Signed)
Left message to call me back about labs  

## 2015-05-12 ENCOUNTER — Telehealth: Payer: Self-pay | Admitting: Adult Health

## 2015-05-12 NOTE — Telephone Encounter (Signed)
Pt aware of labs, take vitamin D3 2000 iu

## 2015-05-12 NOTE — Telephone Encounter (Signed)
PT called stating that she has missed Jennifer's phone call yesterday regarding her blood work. Pt would like a call back. Please contact pt

## 2016-02-10 DIAGNOSIS — D492 Neoplasm of unspecified behavior of bone, soft tissue, and skin: Secondary | ICD-10-CM | POA: Diagnosis not present

## 2016-02-10 DIAGNOSIS — L578 Other skin changes due to chronic exposure to nonionizing radiation: Secondary | ICD-10-CM | POA: Diagnosis not present

## 2016-02-10 DIAGNOSIS — L739 Follicular disorder, unspecified: Secondary | ICD-10-CM | POA: Diagnosis not present

## 2016-02-10 DIAGNOSIS — C44619 Basal cell carcinoma of skin of left upper limb, including shoulder: Secondary | ICD-10-CM | POA: Diagnosis not present

## 2016-05-06 ENCOUNTER — Ambulatory Visit (HOSPITAL_COMMUNITY)
Admission: RE | Admit: 2016-05-06 | Discharge: 2016-05-06 | Disposition: A | Discharge status: Home / Self Care | Payer: 59 | Source: Ambulatory Visit | Attending: Family Medicine | Admitting: Family Medicine

## 2016-05-06 ENCOUNTER — Other Ambulatory Visit (HOSPITAL_COMMUNITY): Payer: Self-pay | Admitting: Family Medicine

## 2016-05-06 DIAGNOSIS — R059 Cough, unspecified: Secondary | ICD-10-CM

## 2016-05-06 DIAGNOSIS — R05 Cough: Secondary | ICD-10-CM

## 2016-05-06 DIAGNOSIS — R918 Other nonspecific abnormal finding of lung field: Secondary | ICD-10-CM | POA: Diagnosis not present

## 2016-05-10 DIAGNOSIS — Z85828 Personal history of other malignant neoplasm of skin: Secondary | ICD-10-CM | POA: Diagnosis not present

## 2016-05-10 DIAGNOSIS — L578 Other skin changes due to chronic exposure to nonionizing radiation: Secondary | ICD-10-CM | POA: Diagnosis not present

## 2016-05-16 ENCOUNTER — Other Ambulatory Visit (HOSPITAL_COMMUNITY): Payer: Self-pay | Admitting: Family Medicine

## 2016-05-16 DIAGNOSIS — J984 Other disorders of lung: Secondary | ICD-10-CM

## 2016-05-30 ENCOUNTER — Ambulatory Visit (HOSPITAL_COMMUNITY)
Admission: RE | Admit: 2016-05-30 | Discharge: 2016-05-30 | Disposition: A | Discharge status: Home / Self Care | Payer: 59 | Source: Ambulatory Visit | Attending: Family Medicine | Admitting: Family Medicine

## 2016-05-30 DIAGNOSIS — J984 Other disorders of lung: Secondary | ICD-10-CM | POA: Diagnosis present

## 2016-05-30 DIAGNOSIS — R918 Other nonspecific abnormal finding of lung field: Secondary | ICD-10-CM | POA: Insufficient documentation

## 2016-06-10 DIAGNOSIS — J9811 Atelectasis: Secondary | ICD-10-CM | POA: Diagnosis not present

## 2016-06-10 DIAGNOSIS — R938 Abnormal findings on diagnostic imaging of other specified body structures: Secondary | ICD-10-CM | POA: Diagnosis not present

## 2016-06-10 DIAGNOSIS — R05 Cough: Secondary | ICD-10-CM | POA: Diagnosis not present

## 2016-07-06 ENCOUNTER — Institutional Professional Consult (permissible substitution): Payer: 59 | Admitting: Pulmonary Disease

## 2016-08-25 ENCOUNTER — Institutional Professional Consult (permissible substitution): Payer: 59 | Admitting: Internal Medicine

## 2016-09-13 DIAGNOSIS — Z85828 Personal history of other malignant neoplasm of skin: Secondary | ICD-10-CM | POA: Diagnosis not present

## 2016-09-13 DIAGNOSIS — L821 Other seborrheic keratosis: Secondary | ICD-10-CM | POA: Diagnosis not present

## 2016-09-13 DIAGNOSIS — L578 Other skin changes due to chronic exposure to nonionizing radiation: Secondary | ICD-10-CM | POA: Diagnosis not present

## 2016-09-13 DIAGNOSIS — L570 Actinic keratosis: Secondary | ICD-10-CM | POA: Diagnosis not present

## 2016-10-21 ENCOUNTER — Encounter: Payer: Self-pay | Admitting: Pulmonary Disease

## 2016-10-21 ENCOUNTER — Ambulatory Visit (INDEPENDENT_AMBULATORY_CARE_PROVIDER_SITE_OTHER): Payer: 59 | Admitting: Pulmonary Disease

## 2016-10-21 VITALS — BP 126/76 | HR 83 | Ht 60.0 in | Wt 131.0 lb

## 2016-10-21 DIAGNOSIS — J471 Bronchiectasis with (acute) exacerbation: Secondary | ICD-10-CM

## 2016-10-21 DIAGNOSIS — J479 Bronchiectasis, uncomplicated: Secondary | ICD-10-CM | POA: Diagnosis not present

## 2016-10-21 MED ORDER — FLUTTER DEVI: 0 refills | Status: DC

## 2016-10-21 NOTE — Patient Instructions (Addendum)
We will get PFTs for better evaluation of a lung function We'll check sputum for regular cultures and AFB and fungus Use Mucinex over-the-counter twice a day and a flutter valve 2-3 times a day for optimal clearance of secretions  I'll see back in the clinic in 3 months time.

## 2016-10-21 NOTE — Progress Notes (Signed)
Kristen Willis    [id number]    12/19/1955  Primary Care Physician:Golding, Jenny Reichmann, MD  Referring Physician: Sharilyn Sites, [address], [place] [address]  Chief complaint:  Consult for evaluation of abnormal CT scan  HPI: 61 year old with no significant past medical history. She reports having an episode of pneumonia in December 2017. This was treated with antibiotics. Subsequent to that she continued to have cough with congestion, small amount of mucus production. No fevers or chills. She sings in a choir and has noticed eating wheezy at times with occasional dyspnea. She had a chest x-ray to primary care which showed a left lung nodular opacity. Subsequent chest x-ray did not show any mass. She does have mild bronchiectasis that was also noticed back in 2009. There is mild atelectasis in the middle lobe and the bases. Also noted is a right middle lobe calcified granuloma that is stable since 2009. There is no family history of lung issues. She reports that her father died at the age of 62 from PE and fungal pneumonia of unclear type.  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 Quit in 1986 [place] recent travel history.  Outpatient Encounter Prescriptions as of 10/21/2016  Medication Sig  . Ascorbic Acid (VITAMIN C) 1000 MG tablet Take 1,000 mg by mouth daily.  . Calcium Carb-Cholecalciferol (CALCIUM + D3) 600-200 MG-UNIT TABS Take 1 tablet by mouth daily.  Marland Kitchen UNABLE TO FIND Rasberry Ketones-takes 1 daily  . vitamin E 400 UNIT capsule Take 400 Units by mouth daily.  . Wheat Dextrin (BENEFIBER PO) Take by mouth daily.   No facility-administered encounter medications on file as of 10/21/2016.     Allergies as of 10/21/2016 - Review Complete 04/01/2015  Allergen Reaction Noted  .  Erythromycin Other (See Comments) 10/21/2011    Past Medical History:  Diagnosis Date  . Depression   . Depression   . Folliculitis 76/72/0947  . Hormone replacement therapy (HRT) 12/31/2012  . Irritable bowel syndrome   . PMB (postmenopausal bleeding) 10/27/2014  . Rectal bleeding 01/30/2013    Past Surgical History:  Procedure Laterality Date  . EYE SURGERY Right    PRK  . TUBAL LIGATION  1993  . WISDOM TOOTH EXTRACTION     early 20's    Family History  Problem Relation Age of Onset  . Seizures Mother   . Hypertension Father   . Seizures Maternal Grandmother   . Heart disease Maternal Grandmother   . Heart attack Maternal Grandfather   . Alzheimer's disease Paternal Grandmother   . Alcohol abuse Paternal Grandfather        recovering  . Colon cancer Neg Hx   . Esophageal cancer Neg Hx   . Rectal cancer Neg Hx   . Stomach cancer Neg Hx     Social History   Social History  . Marital status: Married    Spouse name: N/A  . Number of children: N/A  . Years of education: N/A   Occupational History  . Not on file.   Social History Main Topics  . Smoking status: Former Smoker    Packs/day: 0.50    Years: 8.00    Types: Cigarettes  . Smokeless tobacco: Never Used     Comment: quit in 1986  . Alcohol use  Yes     Comment: rarely  . Drug use: No  . Sexual activity: Yes    Birth control/ protection: Surgical     Comment: tubal   Other Topics Concern  . Not on file   Social History Narrative  . No narrative on file    Review of systems: Review of Systems  Constitutional: Negative for fever and chills.  HENT: Negative.   Eyes: Negative for blurred vision.  Respiratory: as per HPI  Cardiovascular: Negative for chest pain and palpitations.  Gastrointestinal: Negative for vomiting, diarrhea, blood per rectum. Genitourinary: Negative for dysuria, urgency, frequency and hematuria.  Musculoskeletal: Negative for myalgias, back pain and joint pain.  Skin:  Negative for itching and rash.  Neurological: Negative for dizziness, tremors, focal weakness, seizures and loss of consciousness.  Endo/Heme/Allergies: Negative for environmental allergies.  Psychiatric/Behavioral: Negative for depression, suicidal ideas and hallucinations.  All other systems reviewed and are negative.  Physical Exam: Blood pressure 126/76, pulse 83, height 5' (1.524 m), weight 59.4 kg (131 lb), last menstrual period 09/21/2011, SpO2 97 %. Gen:      No acute distress HEENT:  EOMI, sclera anicteric Neck:     No masses; no thyromegaly Lungs:    Clear to auscultation bilaterally; normal respiratory effort CV:         Regular rate and rhythm; no murmurs Abd:      + bowel sounds; soft, non-tender; no palpable masses, no distension Ext:    No edema; adequate peripheral perfusion Skin:      Warm and dry; no rash Neuro: alert and oriented x 3 Psych: normal mood and affect  Data Reviewed: CT scan 10/10/06- right middle lobe granuloma. Mild right middle lobe bronchiectasis Chest x-ray 05/06/16- nodular opacity along the left heart border. CT scan 05/30/16-atelectasis in the right middle lobe and lingula, mild bronchiectasis, stable right middle lobe granuloma I have reviewed all images personally.  Assessment:  Abnormal CT scan Mild atelectasis with bronchiectasis There are no worrisome lung finding suggestive of malignancy.  We'll evaluate with pulmonary function tests. Check sputum for cultures including AFB. Use Mucinex and flutter valve for optimal clearance of secretions We will follow up with a CT scan in one year  Right middle lobe granuloma Stable since 2008, is benign.  Plan/Recommendations: - We'll check pulmonary function tests - Send sputum for culture, AFB and fungus - Follow-up CT in 1 year's time. - Mucinex and flutter valve  Marshell Garfinkel MD [facility] Pager [phone] 10/21/2016, 9:04 AM  CC: Sharilyn Sites, MD

## 2016-10-31 ENCOUNTER — Other Ambulatory Visit: Payer: Self-pay | Admitting: Adult Health

## 2016-10-31 DIAGNOSIS — Z1231 Encounter for screening mammogram for malignant neoplasm of breast: Secondary | ICD-10-CM

## 2016-11-07 ENCOUNTER — Ambulatory Visit (INDEPENDENT_AMBULATORY_CARE_PROVIDER_SITE_OTHER): Payer: 59 | Admitting: Pulmonary Disease

## 2016-11-07 ENCOUNTER — Ambulatory Visit (HOSPITAL_COMMUNITY)
Admission: RE | Admit: 2016-11-07 | Discharge: 2016-11-07 | Disposition: A | Discharge status: Home / Self Care | Payer: 59 | Source: Ambulatory Visit | Attending: Adult Health | Admitting: Adult Health

## 2016-11-07 ENCOUNTER — Encounter (HOSPITAL_COMMUNITY): Payer: Self-pay

## 2016-11-07 DIAGNOSIS — J479 Bronchiectasis, uncomplicated: Secondary | ICD-10-CM | POA: Diagnosis not present

## 2016-11-07 DIAGNOSIS — Z1231 Encounter for screening mammogram for malignant neoplasm of breast: Secondary | ICD-10-CM | POA: Insufficient documentation

## 2016-11-07 LAB — PULMONARY FUNCTION TEST
DL/VA % pred: 124 %
DL/VA: 5.47 ml/min/mmHg/L
DLCO UNC % PRED: 95 %
DLCO UNC: 19.22 ml/min/mmHg
DLCO cor % pred: 90 %
DLCO cor: 18.38 ml/min/mmHg
FEF 25-75 PRE: 2.11 L/s
FEF 25-75 Post: 3.12 L/sec
FEF2575-%Change-Post: 47 %
FEF2575-%PRED-POST: 139 %
FEF2575-%Pred-Pre: 94 %
FEV1-%CHANGE-POST: 8 %
FEV1-%PRED-PRE: 72 %
FEV1-%Pred-Post: 78 %
FEV1-POST: 1.81 L
FEV1-PRE: 1.67 L
FEV1FVC-%Change-Post: 5 %
FEV1FVC-%Pred-Pre: 108 %
FEV6-%Change-Post: 1 %
FEV6-%PRED-POST: 70 %
FEV6-%PRED-PRE: 68 %
FEV6-POST: 2.01 L
FEV6-Pre: 1.98 L
FEV6FVC-%PRED-POST: 104 %
FEV6FVC-%Pred-Pre: 104 %
FVC-%CHANGE-POST: 2 %
FVC-%PRED-PRE: 66 %
FVC-%Pred-Post: 67 %
FVC-POST: 2.03 L
FVC-Pre: 1.98 L
POST FEV6/FVC RATIO: 100 %
PRE FEV1/FVC RATIO: 85 %
PRE FEV6/FVC RATIO: 100 %
Post FEV1/FVC ratio: 89 %
RV % PRED: 96 %
RV: 1.76 L
TLC % PRED: 83 %
TLC: 3.82 L

## 2016-11-07 NOTE — Patient Instructions (Signed)
PFT done today. 

## 2017-01-02 ENCOUNTER — Encounter: Payer: Self-pay | Admitting: Adult Health

## 2017-01-02 ENCOUNTER — Ambulatory Visit (INDEPENDENT_AMBULATORY_CARE_PROVIDER_SITE_OTHER): Payer: 59 | Admitting: Adult Health

## 2017-01-02 VITALS — BP 128/60 | HR 81 | Ht 59.5 in | Wt 130.5 lb

## 2017-01-02 DIAGNOSIS — Z01419 Encounter for gynecological examination (general) (routine) without abnormal findings: Secondary | ICD-10-CM

## 2017-01-02 DIAGNOSIS — Z1211 Encounter for screening for malignant neoplasm of colon: Secondary | ICD-10-CM | POA: Diagnosis not present

## 2017-01-02 DIAGNOSIS — Z1212 Encounter for screening for malignant neoplasm of rectum: Secondary | ICD-10-CM | POA: Diagnosis not present

## 2017-01-02 DIAGNOSIS — K649 Unspecified hemorrhoids: Secondary | ICD-10-CM | POA: Diagnosis not present

## 2017-01-02 DIAGNOSIS — R232 Flushing: Secondary | ICD-10-CM

## 2017-01-02 LAB — HEMOCCULT GUIAC POC 1CARD (OFFICE): Fecal Occult Blood, POC: NEGATIVE

## 2017-01-02 MED ORDER — HYDROCORTISONE ACE-PRAMOXINE 1-1 % RE FOAM: 1.0000 | Freq: Two times a day (BID) | RECTAL | 3 refills | Status: DC

## 2017-01-02 NOTE — Progress Notes (Signed)
Patient ID: TEIARA BARIA, female   DOB: [birth year], 61 y.o.   MRN: [id number] History of Present Illness: Shaquille is a 61 year old white female, married, PM in for well woman gyn exam,she had a normal pap with negative HPV 04/01/15. PCP is Dr Hilma Favors.    Current Medications, Allergies, Past Medical History, Past Surgical History, Family History and Social History were reviewed in [employer] record.     Review of Systems: Patient denies any headaches, hearing loss, fatigue, blurred vision, shortness of breath, chest pain, abdominal pain, problems with bowel movements, urination(leaks at times), or intercourse(has discomfort at times, dry). No joint pain or mood swings.+hot flashes at times. Occasional vaginal odor, uses rephresh.May see brownish color on pad, thinks it is hemorrhoid related, if ever thinks vaginal let me know will get [place].    Physical Exam:BP 128/60 (BP Location: Left Arm, Patient Position: Sitting, Cuff Size: Normal)   Pulse 81   Ht 4' 11.5" (1.511 m)   Wt 130 lb 8 oz (59.2 kg)   LMP 09/21/2011   BMI 25.92 kg/m  General:  Well developed, well nourished, no acute distress Skin:  Warm and dry Neck:  Midline trachea, normal thyroid, good ROM, no lymphadenopathy Lungs; Clear to auscultation bilaterally Breast:  No dominant palpable mass, retraction, or nipple discharge Cardiovascular: Regular rate and rhythm Abdomen:  Soft, non tender, no hepatosplenomegaly Pelvic:  External genitalia is normal in appearance, no lesions.  The vagina is normal in appearance. Urethra has no lesions or masses. The cervix is bulbous.  Uterus is felt to be normal size, shape, and contour.  No adnexal masses or tenderness noted.Bladder is non tender, no masses felt. Rectal: Good sphincter tone, no polyps, + hemorrhoids felt.  Hemoccult negative. Extremities/musculoskeletal:  No swelling or varicosities noted, no clubbing or cyanosis Psych:  No mood changes, alert and  cooperative,seems happy PHQ 2 score 0.  Impression: 1. Encounter for well woman exam with routine gynecological exam   2. Screening for colorectal cancer   3. Hemorrhoids, unspecified hemorrhoid type   4. Hot flashes       Plan: Meds ordered this encounter  Medications  . hydrocortisone-pramoxine (PROCTOFOAM-HC) rectal foam    Sig: Place 1 applicator rectally 2 (two) times daily.    Dispense:  10 g    Refill:  3    Order Specific Question:   Supervising Provider    Answer:   Florian Buff [[id number]]  Physical in 1 year Pap in 2020 Try Luvena, if not better, could try vaginal estrogen for vaginal dryness  Mammogram yearly Labs with PCP  Do Kegels

## 2017-04-12 DIAGNOSIS — J0100 Acute maxillary sinusitis, unspecified: Secondary | ICD-10-CM | POA: Diagnosis not present

## 2017-04-12 DIAGNOSIS — R05 Cough: Secondary | ICD-10-CM | POA: Diagnosis not present

## 2017-05-25 DIAGNOSIS — D229 Melanocytic nevi, unspecified: Secondary | ICD-10-CM | POA: Diagnosis not present

## 2017-05-25 DIAGNOSIS — L814 Other melanin hyperpigmentation: Secondary | ICD-10-CM | POA: Diagnosis not present

## 2017-05-25 DIAGNOSIS — Z85828 Personal history of other malignant neoplasm of skin: Secondary | ICD-10-CM | POA: Diagnosis not present

## 2017-05-29 DIAGNOSIS — R7309 Other abnormal glucose: Secondary | ICD-10-CM | POA: Diagnosis not present

## 2017-05-29 DIAGNOSIS — Z0001 Encounter for general adult medical examination with abnormal findings: Secondary | ICD-10-CM | POA: Diagnosis not present

## 2017-05-29 DIAGNOSIS — E782 Mixed hyperlipidemia: Secondary | ICD-10-CM | POA: Diagnosis not present

## 2017-05-29 DIAGNOSIS — Z23 Encounter for immunization: Secondary | ICD-10-CM | POA: Diagnosis not present

## 2017-05-29 DIAGNOSIS — R490 Dysphonia: Secondary | ICD-10-CM | POA: Diagnosis not present

## 2017-10-19 ENCOUNTER — Telehealth: Payer: Self-pay | Admitting: Pulmonary Disease

## 2017-10-19 DIAGNOSIS — J471 Bronchiectasis with (acute) exacerbation: Secondary | ICD-10-CM

## 2017-10-19 NOTE — Telephone Encounter (Signed)
Pt was scheduled to have a CT chest at [facility] but she is cancelling this appt and having the CT chest done at [facility]. They need the order faxed to their facility so they can schedule pt. Faxed copy of order to [place],  [place]. Nothing further is needed.   Ph. [phone]

## 2017-10-19 NOTE — Telephone Encounter (Signed)
CT reordered, previous one was signed, but Forestine Na couldn't see it. New order placed and signed. [facility] please forward to appropriate people at [facility].

## 2017-10-19 NOTE — Telephone Encounter (Signed)
Kristen Willis with Radiology letting her aware of the communication received and being routed to the [facility] and MD.

## 2017-10-20 NOTE — Telephone Encounter (Signed)
Original order faxed to [facility] and the new order was closed confirmation received

## 2017-10-23 ENCOUNTER — Ambulatory Visit (HOSPITAL_COMMUNITY): Payer: 59

## 2017-10-24 ENCOUNTER — Other Ambulatory Visit: Payer: Self-pay | Admitting: Adult Health

## 2017-10-24 DIAGNOSIS — Z1231 Encounter for screening mammogram for malignant neoplasm of breast: Secondary | ICD-10-CM

## 2017-10-24 DIAGNOSIS — J471 Bronchiectasis with (acute) exacerbation: Secondary | ICD-10-CM | POA: Diagnosis not present

## 2017-10-24 DIAGNOSIS — J479 Bronchiectasis, uncomplicated: Secondary | ICD-10-CM | POA: Diagnosis not present

## 2017-10-24 DIAGNOSIS — J9811 Atelectasis: Secondary | ICD-10-CM | POA: Diagnosis not present

## 2017-10-24 DIAGNOSIS — R911 Solitary pulmonary nodule: Secondary | ICD-10-CM | POA: Diagnosis not present

## 2017-11-09 ENCOUNTER — Ambulatory Visit (HOSPITAL_COMMUNITY)
Admission: RE | Admit: 2017-11-09 | Discharge: 2017-11-09 | Disposition: A | Discharge status: Home / Self Care | Payer: 59 | Source: Ambulatory Visit | Attending: Adult Health | Admitting: Adult Health

## 2017-11-09 DIAGNOSIS — Z1231 Encounter for screening mammogram for malignant neoplasm of breast: Secondary | ICD-10-CM | POA: Diagnosis not present

## 2017-12-29 ENCOUNTER — Encounter: Payer: Self-pay | Admitting: Pulmonary Disease

## 2017-12-29 NOTE — Progress Notes (Signed)
Pt is aware of results and her understanding.  Pt has been scheduled for OV on 02/07/17 at 2:30. Nothing further is needed.

## 2017-12-29 NOTE — Progress Notes (Addendum)
Received report of CT chest from [facility] dated 10/24/2017 No evidence of acute abnormality.  There is mild lingular and right middle lobe atelectasis with associated traction bronchiectasis 3 mm right lower lobe pulmonary nodule.  Please let patient know CT looks stable.  Arrange follow-up in clinic  Marshell Garfinkel MD [facility] 12/29/2017, 9:48 AM

## 2018-02-07 ENCOUNTER — Ambulatory Visit: Payer: 59 | Admitting: Pulmonary Disease

## 2018-02-09 ENCOUNTER — Ambulatory Visit (INDEPENDENT_AMBULATORY_CARE_PROVIDER_SITE_OTHER): Payer: 59 | Admitting: Pulmonary Disease

## 2018-02-09 ENCOUNTER — Encounter: Payer: Self-pay | Admitting: Pulmonary Disease

## 2018-02-09 VITALS — BP 132/72 | HR 65 | Ht 60.0 in | Wt 129.2 lb

## 2018-02-09 DIAGNOSIS — J479 Bronchiectasis, uncomplicated: Secondary | ICD-10-CM

## 2018-02-09 DIAGNOSIS — R911 Solitary pulmonary nodule: Secondary | ICD-10-CM | POA: Diagnosis not present

## 2018-02-09 NOTE — Progress Notes (Signed)
Kristen Willis    [id number]    [birth year]  Primary Care Physician:Golding, Jenny Reichmann, MD  Referring Physician: Sharilyn Sites, [address], [place] [address]  Chief complaint:  Follow up for abnormal CT scan  HPI: 63 year old with no significant past medical history. She reports having an episode of pneumonia in December 2017. This was treated with antibiotics. Subsequent to that she continued to have cough with congestion, small amount of mucus production. No fevers or chills. She sings in a choir and has noticed eating wheezy at times with occasional dyspnea. She had a chest x-ray to primary care which showed a left lung nodular opacity. Subsequent chest x-ray did not show any mass. She does have mild bronchiectasis that was also noticed back in 2009. There is mild atelectasis in the middle lobe and the bases. Also noted is a right middle lobe calcified granuloma that is stable since 2009. There is no family history of lung issues. She reports that her father died at the age of 54 from PE and fungal pneumonia of unclear type.  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 Quit in 1986 [place] No recent travel history.  Interim history: Here for follow-up of CT.  States that she is doing well with no complaints of dyspnea, cough, sputum production, fevers, chills.  Outpatient Encounter Medications as of 02/09/2018  Medication Sig  . Ascorbic Acid (VITAMIN C) 1000 MG tablet Take 1,000 mg by mouth daily.  . Calcium Carb-Cholecalciferol (CALCIUM + D3) 600-200 MG-UNIT TABS Take 1 tablet by mouth daily.  . cholecalciferol (VITAMIN D) 1000 units tablet Take 1,000 Units by mouth daily.  . hydrocortisone-pramoxine (PROCTOFOAM-HC) rectal foam Place 1 applicator rectally 2 (two) times daily.  Marland Kitchen  Respiratory Therapy Supplies (FLUTTER) DEVI Use as directed  . vitamin E 400 UNIT capsule Take 400 Units by mouth daily.  . Wheat Dextrin (BENEFIBER PO) Take by mouth daily.   No facility-administered encounter medications on file as of 02/09/2018.    Physical Exam: Blood pressure 126/76, pulse 83, height 5' (1.524 m), weight 59.4 kg (131 lb), last menstrual period 09/21/2011, SpO2 97 %. Gen:      No acute distress HEENT:  EOMI, sclera anicteric Neck:     No masses; no thyromegaly Lungs:    Clear to auscultation bilaterally; normal respiratory effort CV:         Regular rate and rhythm; no murmurs Abd:      + bowel sounds; soft, non-tender; no palpable masses, no distension Ext:    No edema; adequate peripheral perfusion Skin:      Warm and dry; no rash Neuro: alert and oriented x 3 Psych: normal mood and affect  Data Reviewed: Imaging CT scan 10/10/06- right middle lobe granuloma. Mild right middle lobe bronchiectasis Chest x-ray 05/06/16- nodular opacity along the left heart border. CT scan 05/30/16-atelectasis in the right middle lobe and lingula, mild bronchiectasis, stable right middle lobe granuloma  CT chest from [facility] dated 10/24/2017 No evidence of acute abnormality.  There is mild lingular and right middle lobe atelectasis with associated traction bronchiectasis 3 mm right lower lobe pulmonary nodule.  PFTs 11/07/2016-FVC 2.03 [16%], FEV1 1.81 [78%), F/F 89, TLC and 83%, DLCO 95% Normal test  Assessment:  Abnormal CT scan Mild atelectasis with bronchiectasis There are  no worrisome lung finding suggestive of malignancy.  Unable to produce mucus to test for MAI Since she is asymptomatic we will continue to observe. Use Mucinex and flutter valve for optimal clearance of secretions  Right middle lobe granuloma Stable since 2008, is benign. There is a new right lower lobe nodule which we will follow-up with a CT scan.  She will prefer her follow-up CTs to be done at  [facility].  Plan/Recommendations: - Follow-up CT - Mucinex and flutter valve  Marshell Garfinkel MD [facility] 02/09/2018, 9:13 AM  CC: Sharilyn Sites, MD

## 2018-02-09 NOTE — Patient Instructions (Signed)
I reviewed his CT scan which shows some small lung nodules that are likely benign We will order follow-up CT to be done at [facility] Follow-up in clinic after CT scan

## 2018-04-06 ENCOUNTER — Other Ambulatory Visit (HOSPITAL_COMMUNITY)
Admission: RE | Admit: 2018-04-06 | Discharge: 2018-04-06 | Disposition: A | Discharge status: Home / Self Care | Payer: 59 | Source: Ambulatory Visit | Attending: Adult Health | Admitting: Adult Health

## 2018-04-06 ENCOUNTER — Ambulatory Visit (INDEPENDENT_AMBULATORY_CARE_PROVIDER_SITE_OTHER): Payer: 59 | Admitting: Adult Health

## 2018-04-06 ENCOUNTER — Other Ambulatory Visit: Payer: Self-pay

## 2018-04-06 ENCOUNTER — Encounter: Payer: Self-pay | Admitting: Adult Health

## 2018-04-06 VITALS — BP 122/71 | HR 72 | Ht 60.0 in | Wt 128.0 lb

## 2018-04-06 DIAGNOSIS — Z01419 Encounter for gynecological examination (general) (routine) without abnormal findings: Secondary | ICD-10-CM | POA: Insufficient documentation

## 2018-04-06 DIAGNOSIS — Z1212 Encounter for screening for malignant neoplasm of rectum: Secondary | ICD-10-CM | POA: Diagnosis not present

## 2018-04-06 DIAGNOSIS — Z1211 Encounter for screening for malignant neoplasm of colon: Secondary | ICD-10-CM | POA: Diagnosis not present

## 2018-04-06 LAB — HEMOCCULT GUIAC POC 1CARD (OFFICE): Fecal Occult Blood, POC: NEGATIVE

## 2018-04-06 NOTE — Progress Notes (Addendum)
Patient ID: Kristen Willis, female   DOB: [birth year], 63 y.o.   MRN: [id number] History of Present Illness:  Kristen Willis is a 63 year old white female, married, PM, in for a well woman gyn exam and pap. PCP is Dr Kristen Willis.   Current Medications, Allergies, Past Medical History, Past Surgical History, Family History and Social History were reviewed in [employer] record.     Review of Systems: Patient denies any headaches, hearing loss, fatigue, blurred vision, shortness of breath, chest pain, abdominal pain, problems with bowel movements, urination, or intercourse(has some discomfort and may bleed, due to dryness). No joint pain or mood swings. She walks 30 minutes most days     Physical Exam:BP 122/71 (BP Location: Right Arm, Patient Position: Sitting, Cuff Size: Normal)   Pulse 72   Ht 5' (1.524 m)   Wt 128 lb (58.1 kg)   LMP 09/21/2011   BMI 25.00 kg/m  General:  Well developed, well nourished, no acute distress Skin:  Warm and dry Neck:  Midline trachea, normal thyroid, good ROM, no lymphadenopathy,no carotid bruits heard Lungs; Clear to auscultation bilaterally Breast:  No dominant palpable mass, retraction, or nipple discharge Cardiovascular: Regular rate and rhythm Abdomen:  Soft, non tender, no hepatosplenomegaly Pelvic:  External genitalia is normal in appearance, no lesions.  The vagina is pale with loss of moisture and rugae. Urethra has no lesions or masses. The cervix is smooth with stenotic os, pap with HPV performed.  Uterus is felt to be normal size, shape, and contour.  No adnexal masses or tenderness noted.Bladder is non tender, no masses felt. Rectal: Good sphincter tone, no polyps, or hemorrhoids felt.  Hemoccult negative. Extremities/musculoskeletal:  No swelling or varicosities noted, no clubbing or cyanosis Psych:  No mood changes, alert and cooperative,seems happy Fall risk is low. PHQ 2 score 0. Examination chaperoned by Shela Nevin  RN.  Impression:  1. Encounter for gynecological examination with Papanicolaou smear of cervix   2. Screening for colorectal cancer      Plan: Try luvena  Use lubricate with sex Physical in 1 year Pap in 3 if normal Mammogram yearly Labs with PCP Colonoscopy per GI DEXA at 30.

## 2018-04-09 LAB — CYTOLOGY - PAP
DIAGNOSIS: NEGATIVE
HPV: NOT DETECTED

## 2018-10-16 ENCOUNTER — Other Ambulatory Visit (HOSPITAL_COMMUNITY): Payer: Self-pay | Admitting: Adult Health

## 2018-10-16 DIAGNOSIS — Z1231 Encounter for screening mammogram for malignant neoplasm of breast: Secondary | ICD-10-CM

## 2018-11-16 ENCOUNTER — Other Ambulatory Visit: Payer: Self-pay

## 2018-11-16 ENCOUNTER — Ambulatory Visit (HOSPITAL_COMMUNITY)
Admission: RE | Admit: 2018-11-16 | Discharge: 2018-11-16 | Disposition: A | Discharge status: Home / Self Care | Payer: 59 | Source: Ambulatory Visit | Attending: Adult Health | Admitting: Adult Health

## 2018-11-16 DIAGNOSIS — Z1231 Encounter for screening mammogram for malignant neoplasm of breast: Secondary | ICD-10-CM | POA: Insufficient documentation

## 2018-12-03 ENCOUNTER — Telehealth: Payer: Self-pay | Admitting: *Deleted

## 2018-12-03 MED ORDER — NITROFURANTOIN MONOHYD MACRO 100 MG PO CAPS: 100.0000 mg | ORAL_CAPSULE | Freq: Two times a day (BID) | ORAL | 0 refills | Status: DC

## 2018-12-03 NOTE — Telephone Encounter (Signed)
Patient left message on nurse line that she would like to discuss a "problem that arose this weekend" with Anderson Malta.

## 2018-12-03 NOTE — Telephone Encounter (Signed)
Pt having UTI symptoms has tired OTC, will rx macrobid and push fluids, call if not better

## 2018-12-03 NOTE — Addendum Note (Signed)
Addended by: Derrek Monaco A on: 12/03/2018 01:57 PM   Modules accepted: Orders

## 2019-01-16 ENCOUNTER — Other Ambulatory Visit: Payer: Self-pay

## 2019-01-16 ENCOUNTER — Ambulatory Visit: Payer: 59 | Attending: Internal Medicine

## 2019-01-16 DIAGNOSIS — Z20822 Contact with and (suspected) exposure to covid-19: Secondary | ICD-10-CM

## 2019-01-17 LAB — NOVEL CORONAVIRUS, NAA: SARS-CoV-2, NAA: NOT DETECTED

## 2019-02-26 ENCOUNTER — Telehealth: Payer: Self-pay | Admitting: Pulmonary Disease

## 2019-02-26 NOTE — Telephone Encounter (Signed)
Otila Kluver from [facility] calling to let Dr. Vaughan Browner know that he sent the ct order over unsigned and just need to be signed. Otila Kluver is faxing the order back over for it to be signed either electonically.

## 2019-02-26 NOTE — Telephone Encounter (Signed)
Spoke with Laurene Footman the CT order for her at [phone] It was already electronically signed by Dr Vaughan Browner on 02/10/2019

## 2019-02-28 ENCOUNTER — Telehealth: Payer: Self-pay | Admitting: Pulmonary Disease

## 2019-02-28 NOTE — Telephone Encounter (Signed)
Spoke with Marisue Brooklyn w/ [facility] who advises that she is changing the location to [facility].  Ref# [id number] 4:30PM EST  Gave Aaron Edelman this info.  Nothing further needed at this time.

## 2019-12-18 ENCOUNTER — Other Ambulatory Visit (HOSPITAL_COMMUNITY): Payer: Self-pay | Admitting: Adult Health

## 2019-12-18 DIAGNOSIS — Z1231 Encounter for screening mammogram for malignant neoplasm of breast: Secondary | ICD-10-CM

## 2019-12-19 ENCOUNTER — Ambulatory Visit (HOSPITAL_COMMUNITY): Admission: RE | Admit: 2019-12-19 | Payer: 59 | Source: Ambulatory Visit

## 2019-12-23 ENCOUNTER — Other Ambulatory Visit: Payer: Self-pay

## 2019-12-23 ENCOUNTER — Ambulatory Visit (HOSPITAL_COMMUNITY)
Admission: RE | Admit: 2019-12-23 | Discharge: 2019-12-23 | Disposition: A | Discharge status: Home / Self Care | Payer: 59 | Source: Ambulatory Visit | Attending: Adult Health | Admitting: Adult Health

## 2019-12-23 DIAGNOSIS — Z1231 Encounter for screening mammogram for malignant neoplasm of breast: Secondary | ICD-10-CM | POA: Insufficient documentation

## 2020-01-31 DIAGNOSIS — J019 Acute sinusitis, unspecified: Secondary | ICD-10-CM | POA: Diagnosis not present

## 2020-04-10 DIAGNOSIS — R059 Cough, unspecified: Secondary | ICD-10-CM | POA: Diagnosis not present

## 2020-04-10 DIAGNOSIS — Z20822 Contact with and (suspected) exposure to covid-19: Secondary | ICD-10-CM | POA: Diagnosis not present

## 2020-06-03 ENCOUNTER — Encounter: Payer: Self-pay | Admitting: Adult Health

## 2020-06-03 ENCOUNTER — Other Ambulatory Visit: Payer: Self-pay

## 2020-06-03 ENCOUNTER — Ambulatory Visit (INDEPENDENT_AMBULATORY_CARE_PROVIDER_SITE_OTHER): Payer: BC Managed Care – PPO | Admitting: Adult Health

## 2020-06-03 VITALS — BP 137/72 | HR 79 | Ht 59.0 in | Wt 126.0 lb

## 2020-06-03 DIAGNOSIS — Z1211 Encounter for screening for malignant neoplasm of colon: Secondary | ICD-10-CM | POA: Diagnosis not present

## 2020-06-03 DIAGNOSIS — Z01419 Encounter for gynecological examination (general) (routine) without abnormal findings: Secondary | ICD-10-CM

## 2020-06-03 DIAGNOSIS — K625 Hemorrhage of anus and rectum: Secondary | ICD-10-CM

## 2020-06-03 DIAGNOSIS — Z780 Asymptomatic menopausal state: Secondary | ICD-10-CM | POA: Insufficient documentation

## 2020-06-03 DIAGNOSIS — K649 Unspecified hemorrhoids: Secondary | ICD-10-CM | POA: Diagnosis not present

## 2020-06-03 LAB — HEMOCCULT GUIAC POC 1CARD (OFFICE): Fecal Occult Blood, POC: NEGATIVE

## 2020-06-03 NOTE — Progress Notes (Signed)
Patient ID: Kristen Willis, female   DOB: [birth year], 65 y.o.   MRN: [id number] History of Present Illness: Kristen Willis is a 65 year old white female,married, PM in for a well woman gyn exam. Lab Results  Component Value Date   DIAGPAP  04/06/2018    NEGATIVE FOR INTRAEPITHELIAL LESIONS OR MALIGNANCY.   HPV NOT DETECTED 04/06/2018  PCP is Dr Hilma Favors.   Current Medications, Allergies, Past Medical History, Past Surgical History, Family History and Social History were reviewed in [employer] record.     Review of Systems:  Patient denies any headaches, hearing loss, fatigue, blurred vision, shortness of breath, chest pain, abdominal pain, problems with bowel movements(may go a lot for 3 days then not go for several days), urination, or intercourse. No joint pain or mood swings. Has hemorrhoid that bleeds at times, no pain. No vaginal bleeding.  Physical Exam:BP 137/72 (BP Location: Left Arm, Patient Position: Sitting, Cuff Size: Normal)   Pulse 79   Ht 4\' 11"  (1.499 m)   Wt 126 lb (57.2 kg)   LMP 09/21/2011   BMI 25.45 kg/m  General:  Well developed, well nourished, no acute distress Skin:  Warm and dry Neck:  Midline trachea, normal thyroid, good ROM, no lymphadenopathy,no carotid bruits heard. Lungs; Clear to auscultation bilaterally Breast:  No dominant palpable mass, retraction, or nipple discharge Cardiovascular: Regular rate and rhythm Abdomen:  Soft, non tender, no hepatosplenomegaly Pelvic:  External genitalia is normal in appearance, no lesions.  The vagina is pale with loss of moisture and rugae.Marland Kitchen Urethra has no lesions or masses. The cervix is smooth.  Uterus is felt to be normal size, shape, and contour.  No adnexal masses or tenderness noted.Bladder is non tender, no masses felt. Rectal: Good sphincter tone, no polyps,+ hemorrhoids felt,and has external hemorrhoids x 2.  Hemoccult negative. Extremities/musculoskeletal:  No swelling or varicosities  noted, no clubbing or cyanosis Psych:  No mood changes, alert and cooperative,seems happy AA is 2  Fall risk is moderate PHQ 9 score is 0  GAD 7 score is 0  [facility] - 06/03/20 0845      Pregnancy Intention Screening   Does the patient want to become pregnant in the next year? N/A    Does the patient's partner want to become pregnant in the next year? N/A    Would the patient like to discuss contraceptive options today? N/A      Contraception Wrap Up   Current Method Female Sterilization    End Method Female Sterilization    Contraception Counseling Provided No         Examination chaperoned by Levy Pupa LPN  Impression and Plan: 1. Encounter for well woman exam with routine gynecological exam Pap and physical in 1 year Mammogram in December Colonoscopy 2023 Labs with PCP  2. Encounter for screening fecal occult blood testing  3. Hemorrhoids, unspecified hemorrhoid type Use hemorrhoid cream and try to push in at night If wants surgical referral let me know  4. Postmenopausal   5. Rectal bleeding

## 2020-09-02 DIAGNOSIS — Z6824 Body mass index (BMI) 24.0-24.9, adult: Secondary | ICD-10-CM | POA: Diagnosis not present

## 2020-09-02 DIAGNOSIS — E782 Mixed hyperlipidemia: Secondary | ICD-10-CM | POA: Diagnosis not present

## 2020-09-02 DIAGNOSIS — J309 Allergic rhinitis, unspecified: Secondary | ICD-10-CM | POA: Diagnosis not present

## 2020-09-02 DIAGNOSIS — E7849 Other hyperlipidemia: Secondary | ICD-10-CM | POA: Diagnosis not present

## 2020-09-02 DIAGNOSIS — Z0001 Encounter for general adult medical examination with abnormal findings: Secondary | ICD-10-CM | POA: Diagnosis not present

## 2020-09-02 DIAGNOSIS — Z1389 Encounter for screening for other disorder: Secondary | ICD-10-CM | POA: Diagnosis not present

## 2020-09-02 DIAGNOSIS — R7309 Other abnormal glucose: Secondary | ICD-10-CM | POA: Diagnosis not present

## 2020-09-02 DIAGNOSIS — Z1331 Encounter for screening for depression: Secondary | ICD-10-CM | POA: Diagnosis not present

## 2020-11-25 ENCOUNTER — Ambulatory Visit: Payer: 59 | Admitting: Allergy & Immunology

## 2020-12-03 ENCOUNTER — Ambulatory Visit: Payer: 59 | Admitting: Allergy

## 2021-01-20 ENCOUNTER — Ambulatory Visit (INDEPENDENT_AMBULATORY_CARE_PROVIDER_SITE_OTHER): Payer: BC Managed Care – PPO | Admitting: Allergy & Immunology

## 2021-01-20 ENCOUNTER — Encounter: Payer: Self-pay | Admitting: Allergy & Immunology

## 2021-01-20 ENCOUNTER — Other Ambulatory Visit: Payer: Self-pay

## 2021-01-20 VITALS — BP 126/78 | HR 67 | Temp 97.6°F | Resp 18 | Ht 59.0 in | Wt 131.6 lb

## 2021-01-20 DIAGNOSIS — J3089 Other allergic rhinitis: Secondary | ICD-10-CM | POA: Diagnosis not present

## 2021-01-20 DIAGNOSIS — J302 Other seasonal allergic rhinitis: Secondary | ICD-10-CM

## 2021-01-20 MED ORDER — RYALTRIS 665-25 MCG/ACT NA SUSP: 2.0000 | Freq: Two times a day (BID) | NASAL | 1 refills | Status: DC | PRN

## 2021-01-20 MED ORDER — LEVOCETIRIZINE DIHYDROCHLORIDE 5 MG PO TABS: 5.0000 mg | ORAL_TABLET | Freq: Every evening | ORAL | 5 refills | Status: AC

## 2021-01-20 NOTE — Progress Notes (Signed)
NEW PATIENT  Date of Service/Encounter:  01/20/21  Consult requested by: Sharilyn Sites, MD   Assessment:   Seasonal and perennial allergic rhinitis (grasses, ragweed, weeds, trees, indoor molds, cat, dog, and cockroach)  Plan/Recommendations:   1. Seasonal and perennial allergic rhinitis - Testing today showed: grasses, ragweed, weeds, trees, indoor molds, cat, dog, and cockroach. - Copy of test results provided.  - Avoidance measures provided. - Stop taking: all of your current medications - Start taking: Xyzal (levocetirizine) 5mg  tablet once daily and Ryaltris (olopatadine/mometasone) two sprays per nostril 1-2 times daily as needed - You can use an extra dose of the antihistamine, if needed, for breakthrough symptoms.  - Consider nasal saline rinses 1-2 times daily to remove allergens from the nasal cavities as well as help with mucous clearance (this is especially helpful to do before the nasal sprays are given) - Some people like to alternate antihistamines, changing every month or so to maintain efficacy. - Consider allergy shots as a means of long-term control. - Allergy shots "re-train" and "reset" the immune system to ignore environmental allergens and decrease the resulting immune response to those allergens (sneezing, itchy watery eyes, runny nose, nasal congestion, etc).    - Allergy shots improve symptoms in 75-85% of patients.  - We can discuss more at the next appointment if the medications are not working for you. - You can also check with your insurance company to check on coverage.  2. Return in about 6 weeks (around 03/03/2021).      This note in its entirety was forwarded to the Provider who requested this consultation.  Subjective:   Kristen Willis is a 66 y.o. female presenting today for evaluation of  Chief Complaint  Patient presents with   Allergy Testing   Nasal Congestion    Kristen Willis has a history of the following: Patient Active  Problem List   Diagnosis Date Noted   Encounter for screening fecal occult blood testing 06/03/2020   Postmenopausal 06/03/2020   Encounter for well woman exam with routine gynecological exam 01/02/2017   Hot flashes 01/02/2017   PMB (postmenopausal bleeding) 53/61/4431   Folliculitis 54/00/8676   Rectal bleeding 01/30/2013   Hemorrhoids 01/30/2013   Hormone replacement therapy (HRT) 12/31/2012   Left leg pain 12/31/2012   Depression 12/31/2012    History obtained from: chart review and patient.  Kristen Willis was referred by Sharilyn Sites, MD.     Kristen Willis is a 66 y.o. female presenting for an evaluation of environmental allergies  .  Allergic Rhinitis Symptom History: She reports that she has postnasal drip that is worse in the fall. She does not feel completely perfect during any time of the year. She has a lot of sinus problems. She has tried Flonase and Claritin in the past without much relief.  If her symptoms are particularly severe, the Claritin and Flonase will help some, but after a couple of weeks, it starts to get worse even with the medications on board. She has never seen an allergist. She did see an ENT in [place]. This was over two years ago and she tried Astelin without much relief. He did not do a laryngoscopy. She has tried reflux medications without improvement. Nasal sprays never seemed to help a lot. She never went back to him. He never mentioned polyps and never mentioned a septal deviation. Symptoms overall are worse on the left side.   She does get some antibiotics if she develops a fever.  Otherwise she has not needed recurrent antibiotics. She does get prednisone for these symptoms around twice per year, sometimes three times. Symptoms do not change from works versus home. She really cannot tell a different inside versus outside.   Food Allergy Symptom History: Evidently she had a panel done by Dr. Hilma Favors and she was positive to some wheat allergy. She is not  avoiding any where, however.    Otherwise, there is no history of other atopic diseases, including asthma, food allergies, drug allergies, stinging insect allergies, eczema, urticaria, or contact dermatitis. There is no significant infectious history. Vaccinations are up to date.    Past Medical History: Patient Active Problem List   Diagnosis Date Noted   Encounter for screening fecal occult blood testing 06/03/2020   Postmenopausal 06/03/2020   Encounter for well woman exam with routine gynecological exam 01/02/2017   Hot flashes 01/02/2017   PMB (postmenopausal bleeding) 81/82/9937   Folliculitis 16/96/7893   Rectal bleeding 01/30/2013   Hemorrhoids 01/30/2013   Hormone replacement therapy (HRT) 12/31/2012   Left leg pain 12/31/2012   Depression 12/31/2012    Medication List:  Allergies as of 01/20/2021       Reactions   Erythromycin Other (See Comments)   Stomach cramps/nausea        Medication List        Accurate as of January 20, 2021  1:57 PM. If you have any questions, ask your nurse or doctor.          BENEFIBER PO Take by mouth daily.   Calcium + D3 600-200 MG-UNIT Tabs Take 1 tablet by mouth daily.   cholecalciferol 1000 units tablet Commonly known as: VITAMIN D Take 1,000 Units by mouth daily.   fluticasone 50 MCG/ACT nasal spray Commonly known as: FLONASE   hydrocortisone cream 1 % Apply 1 application topically as needed.   levocetirizine 5 MG tablet Commonly known as: XYZAL Take 1 tablet (5 mg total) by mouth every evening. Started by: Valentina Shaggy, MD   rosuvastatin 5 MG tablet Commonly known as: CRESTOR Take 5 mg by mouth daily.   Ryaltris 810-17 MCG/ACT Susp Generic drug: Olopatadine-Mometasone Place 2 sprays into the nose 2 (two) times daily as needed. Started by: Valentina Shaggy, MD   vitamin C 1000 MG tablet Take 1,000 mg by mouth daily.   vitamin E 180 MG (400 UNITS) capsule Take 400 Units by mouth daily.         Birth History: non-contributory  Developmental History: non-contributory  Past Surgical History: Past Surgical History:  Procedure Laterality Date   EYE SURGERY Right    [facility]   TUBAL LIGATION  1993   WISDOM TOOTH EXTRACTION     early 20's     Family History: Family History  Problem Relation Age of Onset   Seizures Mother    Hypertension Father    Allergic rhinitis Brother    Seizures Maternal Grandmother    Heart disease Maternal Grandmother    Heart attack Maternal Grandfather    Alzheimer's disease Paternal Grandmother    Alcohol abuse Paternal Grandfather        recovering   Colon cancer Neg Hx    Esophageal cancer Neg Hx    Rectal cancer Neg Hx    Stomach cancer Neg Hx      Social History: Chayse lives at home with her husband.  Lives in a house that is 76 years old.  There are hardwood floors throughout the home.  They  have a heat pump for heating and cooling.  There is 1 golden retriever inside of the home.  There are no dust mite covers on the bedding.  There is tobacco exposure in the car.  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  She is not exposed to fumes, chemicals, or dust.  She does not use a HEPA filter.   Review of Systems  Constitutional: Negative.  Negative for chills, fever, malaise/fatigue and weight loss.  HENT:  Positive for congestion and sinus pain. Negative for ear discharge and ear pain.   Eyes:  Negative for pain, discharge and redness.  Respiratory:  Negative for cough, sputum production, shortness of breath and wheezing.   Cardiovascular: Negative.  Negative for chest pain and palpitations.  Gastrointestinal:  Negative for abdominal pain, constipation, diarrhea, heartburn, nausea and vomiting.  Skin: Negative.  Negative for itching and rash.  Neurological:  Negative for dizziness and headaches.  Endo/Heme/Allergies:  Negative for environmental allergies. Does not bruise/bleed easily.      Objective:    Blood pressure 126/78, pulse 67, temperature 97.6 F (36.4 C), temperature source Temporal, resp. rate 18, height 4\' 11"  (1.499 m), weight 131 lb 9.6 oz (59.7 kg), last menstrual period 09/21/2011, SpO2 97 %. Body mass index is 26.58 kg/m.   Physical Exam:   Physical Exam Vitals reviewed.  Constitutional:      Appearance: She is well-developed.     Comments: Pleasant female.  HENT:     Head: Normocephalic and atraumatic.     Right Ear: Tympanic membrane, ear canal and external ear normal. No drainage, swelling or tenderness. Tympanic membrane is not injected, scarred, erythematous, retracted or bulging.     Left Ear: Tympanic membrane, ear canal and external ear normal. No drainage, swelling or tenderness. Tympanic membrane is not injected, scarred, erythematous, retracted or bulging.     Nose: No nasal deformity, septal deviation, mucosal edema or rhinorrhea.     Right Turbinates: Enlarged, swollen and pale.     Left Turbinates: Enlarged, swollen and pale.     Right Sinus: No maxillary sinus tenderness or frontal sinus tenderness.     Left Sinus: No maxillary sinus tenderness or frontal sinus tenderness.     Mouth/Throat:     Lips: Pink.     Mouth: Mucous membranes are moist. Mucous membranes are not pale and not dry.     Pharynx: Uvula midline.     Comments: Copious nasal drainage.  Moderate cobblestoning. Eyes:     General:        Right eye: No discharge.        Left eye: No discharge.     Conjunctiva/sclera: Conjunctivae normal.     Right eye: Right conjunctiva is not injected. No chemosis.    Left eye: Left conjunctiva is not injected. No chemosis.    Pupils: Pupils are equal, round, and reactive to light.  Cardiovascular:     Rate and Rhythm: Normal rate and regular rhythm.     Heart sounds: Normal heart sounds.  Pulmonary:     Effort: Pulmonary effort is normal. No tachypnea, accessory muscle usage or respiratory distress.     Breath sounds: Normal breath sounds.  No wheezing, rhonchi or rales.  Chest:     Chest wall: No tenderness.  Abdominal:     Tenderness: There is no abdominal tenderness. There is no guarding or rebound.  Lymphadenopathy:     Head:     Right side of  head: No submandibular, tonsillar or occipital adenopathy.     Left side of head: No submandibular, tonsillar or occipital adenopathy.     Cervical: No cervical adenopathy.  Skin:    Coloration: Skin is not pale.     Findings: No abrasion, erythema, petechiae or rash. Rash is not papular, urticarial or vesicular.  Neurological:     Mental Status: She is alert.  Psychiatric:        Behavior: Behavior is cooperative.     Diagnostic studies:    Allergy Studies:     Airborne Adult Perc - 01/20/21 0914     Time Antigen Placed 0914    Allergen Manufacturer Lavella Hammock    Location Back    Number of Test 59    Panel 1 Select    1. Control-Buffer 50% Glycerol Negative    2. Control-Histamine 1 mg/ml 3+    3. Albumin saline Negative    4. [place] Negative    5. [place] 3+    6. Johnson Negative    7. [place] Blue Negative    8. Meadow Fescue Negative    9. Perennial Rye Negative    10. Sweet Vernal Negative    11. Timothy Negative    12. Cocklebur Negative    13. Burweed Marshelder Negative    14. Ragweed, short Negative    15. Ragweed, Giant Negative    16. Plantain,  English Negative    17. Lamb's Quarters Negative    18. Sheep Sorrell Negative    19. Rough Pigweed Negative    20. Marsh Elder, Rough Negative    21. Mugwort, Common Negative    22. Ash mix Negative    23. Birch mix Negative    24. Beech American Negative    25. Box, Elder Negative    26. Cedar, red Negative    27. Cottonwood, [place] Negative    28. Elm mix Negative    29. Hickory Negative    30. Maple mix Negative    31. Oak, [place] mix Negative    32. Pecan Pollen Negative    33. Pine mix Negative    34. Sycamore Eastern Negative    35. [place], Black Pollen Negative    36. Alternaria  alternata Negative    37. Cladosporium Herbarum Negative    38. Aspergillus mix Negative    39. Penicillium mix Negative    40. Bipolaris sorokiniana (Helminthosporium) Negative    41. Drechslera spicifera (Curvularia) Negative    42. Mucor plumbeus Negative    43. Fusarium moniliforme Negative    44. Aureobasidium pullulans (pullulara) Negative    45. Rhizopus oryzae Negative    46. Botrytis cinera Negative    47. Epicoccum nigrum Negative    48. Phoma betae Negative    49. Candida Albicans Negative    50. Trichophyton mentagrophytes Negative    51. Mite, D Farinae  5,000 AU/ml Negative    52. Mite, D Pteronyssinus  5,000 AU/ml Negative    53. Cat Hair 10,000 BAU/ml Negative    54.  Dog Epithelia Negative    55. Mixed Feathers Negative    56. Horse Epithelia Negative    57. Cockroach, German Negative    58. Mouse Negative    59. Tobacco Leaf Negative             Intradermal - 01/20/21 0934     Time Antigen Placed 0935    Allergen Manufacturer Lavella Hammock    Location Arm    Number  of Test 14    Intradermal Select    Control Negative    Johnson 3+    7 Grass Negative    Ragweed mix 2+    Weed mix 2+    Tree mix 3+    Mold 1 Negative    Mold 2 2+    Mold 3 Negative    Mold 4 2+    Cat 2+    Dog 2+    Cockroach 3+    Mite mix Negative             Allergy testing results were read and interpreted by myself, documented by clinical staff.         Salvatore Marvel, MD [facility] [place] [facility] [place]

## 2021-01-20 NOTE — Patient Instructions (Addendum)
1. Seasonal and perennial allergic rhinitis - Testing today showed: grasses, ragweed, weeds, trees, indoor molds, cat, dog, and cockroach. - Copy of test results provided.  - Avoidance measures provided. - Stop taking: all of your current medications - Start taking: Xyzal (levocetirizine) 5mg  tablet once daily and Ryaltris (olopatadine/mometasone) two sprays per nostril 1-2 times daily as needed - You can use an extra dose of the antihistamine, if needed, for breakthrough symptoms.  - Consider nasal saline rinses 1-2 times daily to remove allergens from the nasal cavities as well as help with mucous clearance (this is especially helpful to do before the nasal sprays are given) - Some people like to alternate antihistamines, changing every month or so to maintain efficacy. - Consider allergy shots as a means of long-term control. - Allergy shots "re-train" and "reset" the immune system to ignore environmental allergens and decrease the resulting immune response to those allergens (sneezing, itchy watery eyes, runny nose, nasal congestion, etc).    - Allergy shots improve symptoms in 75-85% of patients.  - We can discuss more at the next appointment if the medications are not working for you. - You can also check with your insurance company to check on coverage.  2. Return in about 6 weeks (around 03/03/2021).    Please inform us of any Emergency Department visits, hospitalizations, or changes in symptoms. Call us before going to the ED for breathing or allergy symptoms since we might be able to fit you in for a sick visit. Feel free to contact us anytime with any questions, problems, or concerns.  It was a pleasure to meet you today!  Websites that have reliable patient information: 1. American Academy of Asthma, Allergy, and Immunology: www.aaaai.org 2. Food Allergy Research and Education (FARE): foodallergy.org 3. Mothers of Asthmatics: http://www.asthmacommunitynetwork.org 4. American  College of Allergy, Asthma, and Immunology: www.acaai.org   COVID-19 Vaccine Information can be found at: [email or web address].[email or web address].uk For questions related to vaccine distribution or appointments, please email [email or web address] or call [phone].   We realize that you might be concerned about having an allergic reaction to the COVID19 vaccines. To help with that concern, WE ARE OFFERING THE COVID19 VACCINES IN OUR OFFICE! Ask the front desk for dates!     Like [place] on [employer] and Instagram for our latest updates!      A healthy democracy works best when [employer] participate! Make sure you are registered to vote! If you have moved or changed any of your contact information, you will need to get this updated before voting!  In some cases, you MAY be able to register to vote online: [email or web address]     Airborne Adult Perc - 01/20/21 0914     Time Antigen Placed 0914    Allergen Manufacturer Lavella Hammock    Location Back    Number of Test 59    Panel 1 Select    1. Control-Buffer 50% Glycerol Negative    2. Control-Histamine 1 mg/ml 3+    3. Albumin saline Negative    4. [place] Negative    5. [place] 3+    6. Johnson Negative    7. Ferney Blue Negative    8. Meadow Fescue Negative    9. Perennial Rye Negative    10. Sweet Vernal Negative    11. Timothy Negative    12. Cocklebur Negative    13. Burweed Marshelder Negative    14. Ragweed, short Negative    15. Ragweed, Giant Negative    16. Plantain,  English Negative    17. Lamb's Quarters Negative    18. Sheep Sorrell Negative    19. Rough Pigweed Negative    20. Marsh Elder, Rough Negative    21. Mugwort, Common Negative    22. Ash mix Negative    23. Birch mix Negative    24. Beech American Negative    25. Box, Elder Negative    26. Cedar, red Negative    27. Cottonwood, [place] Negative    28. Elm mix Negative    29. Hickory  Negative    30. Maple mix Negative    31. Oak, [place] mix Negative    32. Pecan Pollen Negative    33. Pine mix Negative    34. Sycamore Eastern Negative    35. [place], Black Pollen Negative    36. Alternaria alternata Negative    37. Cladosporium Herbarum Negative    38. Aspergillus mix Negative    39. Penicillium mix Negative    40. Bipolaris sorokiniana (Helminthosporium) Negative    41. Drechslera spicifera (Curvularia) Negative    42. Mucor plumbeus Negative    43. Fusarium moniliforme Negative    44. Aureobasidium pullulans (pullulara) Negative    45. Rhizopus oryzae Negative    46. Botrytis cinera Negative    47. Epicoccum nigrum Negative    48. Phoma betae Negative    49. Candida Albicans Negative    50. Trichophyton mentagrophytes Negative    51. Mite, D Farinae  5,000 AU/ml Negative    52. Mite, D Pteronyssinus  5,000 AU/ml Negative    53. Cat Hair 10,000 BAU/ml Negative    54.  Dog Epithelia Negative    55. Mixed Feathers Negative    56. Horse Epithelia Negative    57. Cockroach, German Negative    58. Mouse Negative    59. Tobacco Leaf Negative             Intradermal - 01/20/21 0934     Time Antigen Placed 0935    Allergen Manufacturer Lavella Hammock    Location Arm    Number of Test 14    Intradermal Select    Control Negative    Johnson 3+    7 Grass Negative    Ragweed mix 2+    Weed mix 2+    Tree mix 3+    Mold 1 Negative    Mold 2 2+    Mold 3 Negative    Mold 4 2+    Cat 2+    Dog 2+    Cockroach 3+    Mite mix Negative             Reducing Pollen Exposure  The American Academy of Allergy, Asthma and Immunology suggests the following steps to reduce your exposure to pollen during allergy seasons.    Do not hang sheets or clothing out to dry; pollen may collect on these items. Do not mow lawns or spend time around freshly cut grass; mowing stirs up pollen. Keep windows closed at night.  Keep car windows closed while driving. Minimize  morning activities outdoors, a time when pollen counts are usually at their highest. Stay indoors as much as possible when pollen counts or humidity is high and on windy days when pollen tends to remain in the air longer. Use air conditioning when possible.  Many air conditioners have filters that trap the pollen spores. Use a HEPA room air filter to remove pollen form the indoor air you breathe.  Control of Mold Allergen   Mold and fungi can grow on a variety of surfaces provided certain temperature and moisture conditions exist.  Outdoor molds grow on plants, decaying vegetation and soil.  The major outdoor mold, Alternaria and Cladosporium, are found in very high numbers during hot and dry conditions.  Generally, a late Summer - Fall peak is seen for common outdoor fungal spores.  Rain will temporarily lower outdoor mold spore count, but counts rise rapidly when the rainy period ends.  The most important indoor molds are Aspergillus and Penicillium.  Dark, humid and poorly ventilated basements are ideal sites for mold growth.  The next most common sites of mold growth are the bathroom and the kitchen.    Indoor (Perennial) Mold Control   Positive indoor molds via skin testing: Aspergillus, Penicillium, Fusarium, Aureobasidium (Pullulara), and Rhizopus  Maintain humidity below 50%. Clean washable surfaces with 5% bleach solution. Remove sources e.g. contaminated carpets.    Control of Dog or Cat Allergen  Avoidance is the best way to manage a dog or cat allergy. If you have a dog or cat and are allergic to dog or cats, consider removing the dog or cat from the home. If you have a dog or cat but dont want to find it a new home, or if your family wants a pet even though someone in the household is allergic, here are some strategies that may help keep symptoms at bay:  Keep the pet out of your bedroom and restrict it to only a few rooms. Be advised that keeping the dog or cat in only one  room will not limit the allergens to that room. Dont pet, hug or kiss the dog or cat; if you do, wash your hands with soap and water. High-efficiency particulate air (HEPA) cleaners run continuously in a bedroom or living room can reduce allergen levels over time. Regular use of a high-efficiency vacuum cleaner or a central vacuum can reduce allergen levels. Giving your dog or cat a bath at least once a week can reduce airborne allergen.  Control of Cockroach Allergen  Cockroach allergen has been identified as an important cause of acute attacks of asthma, especially in urban settings.  There are fifty-five species of cockroach that exist in the [place], however only three, the [place], [place] species produce allergen that can affect patients with Asthma.  Allergens can be obtained from fecal particles, egg casings and secretions from cockroaches.    Remove food sources. Reduce access to water. Seal access and entry points. Spray runways with 0.5-1% Diazinon or Chlorpyrifos Blow boric acid power under stoves and refrigerator. Place bait stations (hydramethylnon) at feeding sites.  Allergy Shots   Allergies are the result of a chain reaction that starts in the immune system. Your immune system controls how your body defends itself. For instance, if you have an allergy to pollen, your immune system identifies pollen as an invader or allergen. Your immune system overreacts by producing antibodies called Immunoglobulin E (IgE). These antibodies travel to cells that release chemicals, causing an allergic reaction.  The concept behind allergy immunotherapy, whether it is received in the form of shots or tablets, is that the immune system can be desensitized to specific allergens that trigger allergy symptoms. Although it requires time and patience, the payback can be long-term relief.  How Do Allergy Shots Work?  Allergy shots work much like a vaccine. Your body responds to  injected amounts of a particular allergen given  in increasing doses, eventually developing a resistance and tolerance to it. Allergy shots can lead to decreased, minimal or no allergy symptoms.  There generally are two phases: build-up and maintenance. Build-up often ranges from three to six months and involves receiving injections with increasing amounts of the allergens. The shots are typically given once or twice a week, though more rapid build-up schedules are sometimes used.  The maintenance phase begins when the most effective dose is reached. This dose is different for each person, depending on how allergic you are and your response to the build-up injections. Once the maintenance dose is reached, there are longer periods between injections, typically two to four weeks.  Occasionally doctors give cortisone-type shots that can temporarily reduce allergy symptoms. These types of shots are different and should not be confused with allergy immunotherapy shots.  Who Can Be Treated with Allergy Shots?  Allergy shots may be a good treatment approach for people with allergic rhinitis (hay fever), allergic asthma, conjunctivitis (eye allergy) or stinging insect allergy.   Before deciding to begin allergy shots, you should consider:   The length of allergy season and the severity of your symptoms  Whether medications and/or changes to your environment can control your symptoms  Your desire to avoid long-term medication use  Time: allergy immunotherapy requires a major time commitment  Cost: may vary depending on your insurance coverage  Allergy shots for children age 91 and older are effective and often well tolerated. They might prevent the onset of new allergen sensitivities or the progression to asthma.  Allergy shots are not started on patients who are pregnant but can be continued on patients who become pregnant while receiving them. In some patients with other medical conditions or who  take certain common medications, allergy shots may be of risk. It is important to mention other medications you talk to your allergist.   When Will I Feel Better?  Some may experience decreased allergy symptoms during the build-up phase. For others, it may take as long as 12 months on the maintenance dose. If there is no improvement after a year of maintenance, your allergist will discuss other treatment options with you.  If you arent responding to allergy shots, it may be because there is not enough dose of the allergen in your vaccine or there are missing allergens that were not identified during your allergy testing. Other reasons could be that there are high levels of the allergen in your environment or major exposure to non-allergic triggers like tobacco smoke.  What Is the Length of Treatment?  Once the maintenance dose is reached, allergy shots are generally continued for three to five years. The decision to stop should be discussed with your allergist at that time. Some people may experience a permanent reduction of allergy symptoms. Others may relapse and a longer course of allergy shots can be considered.  What Are the Possible Reactions?  The two types of adverse reactions that can occur with allergy shots are local and systemic. Common local reactions include very mild redness and swelling at the injection site, which can happen immediately or several hours after. A systemic reaction, which is less common, affects the entire body or a particular body system. They are usually mild and typically respond quickly to medications. Signs include increased allergy symptoms such as sneezing, a stuffy nose or hives.  Rarely, a serious systemic reaction called anaphylaxis can develop. Symptoms include swelling in the throat, wheezing, a feeling of tightness in the chest,  nausea or dizziness. Most serious systemic reactions develop within 30 minutes of allergy shots. This is why it is strongly  recommended you wait in your doctors office for 30 minutes after your injections. Your allergist is trained to watch for reactions, and his or her staff is trained and equipped with the proper medications to identify and treat them.  Who Should Administer Allergy Shots?  The preferred location for receiving shots is your prescribing allergists office. Injections can sometimes be given at another facility where the physician and staff are trained to recognize and treat reactions, and have received instructions by your prescribing allergist.

## 2021-01-27 ENCOUNTER — Encounter: Payer: Self-pay | Admitting: Nurse Practitioner

## 2021-02-14 NOTE — Progress Notes (Addendum)
02/14/2021 Kristen Willis [id number] 01-22-1955   CHIEF COMPLAINT: Rectal bleeding  HISTORY OF PRESENT ILLNESS:  Kristen Willis is a 66 year old female with a past medical history of depression, allergic rhinitis, elevated cholesterol and internal hemorrhoids.  She presents to our office today as recommended by Derrek Monaco PA-C for further evaluation regarding rectal bleeding and change in bowel pattern.  She complains of passing a small amount of bright red blood on the toilet tissue and sometimes in the toilet water which occurs after most bowel movements for the past 1 to 2 years and has progressively worsened over the past 6 months.  She wears an underwear liner and often sees blood and possibly mucus discharge from the rectum.  Rare anal rectal discomfort, sometimes has anal rectal cramping at nighttime.  She typically passes a normal formed brown bowel movement daily, however, over the past 3 to 4 months she is passing a normal bowel movement for few days and skips 2 to 3 days then feels she needs to have a bowel movement but does not.  No abdominal pain but has occasional abdominal gas bloat.  Increased flatulence.  She takes Benefiber daily.  She does not use laxatives.  She eats a high-fiber diet.  She takes Advil 200 mg 1 tab 2 or 3 days weekly for arthritis.  She underwent a colonoscopy by Dr. Olevia Perches 11/04/2011 which showed internal hemorrhoids, no polyps.  She was advised by Dr. Olevia Perches to repeat a colonoscopy in 10 years.  No known family history of colon polyps or colorectal cancer.  No other complaints at this time.   Past Medical History:  Diagnosis Date   Depression    Depression    Folliculitis 85/46/2703   Hormone replacement therapy (HRT) 12/31/2012   Irritable bowel syndrome    PMB (postmenopausal bleeding) 10/27/2014   Rectal bleeding 01/30/2013   Past Surgical History:  Procedure Laterality Date   EYE SURGERY Right    [place]   TUBAL LIGATION  1993   WISDOM  TOOTH EXTRACTION     early 20's   Social History: She is married.  She is a Marine scientist.  She has 1 son and 1 daughter.  Remote history of smoking cigarettes socially, quit smoking 40 years ago. She drinks 2 glasses of the wine or less on the weekends.  Drug use.  Family History: No family history of esophageal, gastric or colon cancer.  Maternal grandmother had heart disease.  Paternal grandmother had Alzheimer's disease.  Her grandfather with alcohol use disorder.  Allergies  Allergen Reactions   Erythromycin Other (See Comments)    Stomach cramps/nausea      Outpatient Encounter Medications as of 02/15/2021  Medication Sig   Ascorbic Acid (VITAMIN C) 1000 MG tablet Take 1,000 mg by mouth daily.   Calcium Carb-Cholecalciferol (CALCIUM + D3) 600-200 MG-UNIT TABS Take 1 tablet by mouth daily.   cholecalciferol (VITAMIN D) 1000 units tablet Take 1,000 Units by mouth daily.   fluticasone (FLONASE) 50 MCG/ACT nasal spray    hydrocortisone cream 1 % Apply 1 application topically as needed.   levocetirizine (XYZAL) 5 MG tablet Take 1 tablet (5 mg total) by mouth every evening.   rosuvastatin (CRESTOR) 5 MG tablet Take 5 mg by mouth daily.   RYALTRIS [id number] MCG/ACT SUSP Place 2 sprays into the nose 2 (two) times daily as needed.   vitamin E 400 UNIT capsule Take 400 Units by mouth daily.   Wheat Dextrin (  BENEFIBER PO) Take by mouth daily.   No facility-administered encounter medications on file as of 02/15/2021.   REVIEW OF SYSTEMS:  Gen: Denies fever, sweats or chills. No weight loss.  CV: Denies chest pain, palpitations or edema. Resp: Denies cough, shortness of breath of hemoptysis.  GI: See HPI denies heartburn, dysphagia, stomach or lower abdominal pain. GU : Denies urinary burning, blood in urine, increased urinary frequency or incontinence. MS: Denies joint pain, muscles aches or weakness. Derm: Denies rash, itchiness, skin lesions or unhealing ulcers. Psych: Remote  history of depression, no current issues with depression. Heme: Denies bruising, bleeding. Neuro:  Denies headaches, dizziness or paresthesias. Endo:  Denies any problems with DM, thyroid or adrenal function.  PHYSICAL EXAM: LMP 09/21/2011  BP 134/72 (BP Location: Left Arm, Patient Position: Sitting, Cuff Size: Normal)    Pulse 76    Ht 4' 11.25" (1.505 m) Comment: height measured without shoes   Wt 132 lb (59.9 kg)    LMP 09/21/2011    BMI 26.44 kg/m   General: 66 year old female in no acute distress. Head: Normocephalic and atraumatic. Eyes:  Sclerae non-icteric, conjunctive pink. Ears: Normal auditory acuity. Mouth: Dentition intact. No ulcers or lesions.  Neck: Supple, no lymphadenopathy or thyromegaly.  Lungs: Clear bilaterally to auscultation without wheezes, crackles or rhonchi. Heart: Regular rate and rhythm. No murmur, rub or gallop appreciated.  Abdomen: Soft, nontender.  Mild gaseous distention.  No masses. No hepatosplenomegaly. Normoactive bowel sounds x 4 quadrants.  Rectal: Patient deferred exam, to proceed with colonoscopy for further evaluation. Musculoskeletal: Symmetrical with no gross deformities. Skin: Warm and dry. No rash or lesions on visible extremities. Extremities: No edema. Neurological: Alert oriented x 4, no focal deficits.  Psychological:  Alert and cooperative. Normal mood and affect.  ASSESSMENT AND PLAN:  36) 66 year old female with a history of internal hemorrhoids, possibly also has external hemorrhoids with rectal bleeding with mucous per the rectum. Infrequent rectal pains/spasms at nighttime. -Diagnostic colonoscopy benefits and risks discussed including risk with sedation, risk of bleeding, perforation and infection  -Take MiraLAX 1 capful mixed in 8 ounces of water nightly for 5 nights prior to colonoscopy prep date -Apply a small amount of Desitin inside the anal opening and to the external anal area tid as needed for anal or hemorrhoidal  irritation/bleeding. Do not use Desitin morning of colonoscopy. -CBC -Patient to contact office if symptoms worsen -Further recommendations to be determined after colonoscopy completed  2) Change in bowel pattern x 3 to 4 months, constipation with associated abdominal bloat and increased flatulence. -See plan in #1 -MiraLAX nightly as needed -Sinew Benefiber daily   Addendum:  CBC today showed WBC 6.5.  Hemoglobin 14.1.  Hematocrit 40.9.  Platelet 242.       CC:  Sharilyn Sites, MD

## 2021-02-15 ENCOUNTER — Encounter: Payer: Self-pay | Admitting: Nurse Practitioner

## 2021-02-15 ENCOUNTER — Other Ambulatory Visit (INDEPENDENT_AMBULATORY_CARE_PROVIDER_SITE_OTHER): Payer: BC Managed Care – PPO

## 2021-02-15 ENCOUNTER — Ambulatory Visit (INDEPENDENT_AMBULATORY_CARE_PROVIDER_SITE_OTHER): Payer: BC Managed Care – PPO | Admitting: Nurse Practitioner

## 2021-02-15 VITALS — BP 134/72 | HR 76 | Ht 59.25 in | Wt 132.0 lb

## 2021-02-15 DIAGNOSIS — K625 Hemorrhage of anus and rectum: Secondary | ICD-10-CM

## 2021-02-15 DIAGNOSIS — K5900 Constipation, unspecified: Secondary | ICD-10-CM

## 2021-02-15 LAB — CBC
HCT: 40.9 % (ref 36.0–46.0)
Hemoglobin: 14.1 g/dL (ref 12.0–15.0)
MCHC: 34.6 g/dL (ref 30.0–36.0)
MCV: 90.4 fl (ref 78.0–100.0)
Platelets: 242 10*3/uL (ref 150.0–400.0)
RBC: 4.53 Mil/uL (ref 3.87–5.11)
RDW: 12.4 % (ref 11.5–15.5)
WBC: 6.5 10*3/uL (ref 4.0–10.5)

## 2021-02-15 MED ORDER — NA SULFATE-K SULFATE-MG SULF 17.5-3.13-1.6 GM/177ML PO SOLN: 1.0000 | ORAL | 0 refills | Status: DC

## 2021-02-15 NOTE — Patient Instructions (Signed)
PROCEDURES: You have been scheduled for a colonoscopy. Please follow the written instructions given to you at your visit today. Please pick up your prep supplies at the pharmacy within the next 1-3 days. If you use inhalers (even only as needed), please bring them with you on the day of your procedure.  LABS:  Lab work has been ordered for you today. Our lab is located in the basement. Press "B" on the elevator. The lab is located at the first door on the left as you exit the elevator.  HEALTHCARE LAWS AND MY CHART RESULTS:  Due to recent changes in healthcare laws, you may see the results of your imaging and laboratory studies on MyChart before your provider has had a chance to review them.   We understand that in some cases there may be results that are confusing or concerning to you. Not all laboratory results come back in the same time frame and the provider may be waiting for multiple results in order to interpret others.  Please give [place] 48 hours in order for your provider to thoroughly review all the results before contacting the office for clarification of your results.   RECOMMENDATIONS:  Miralax- Dissolve one capful in 8 ounces of water and drink before bed five nights prior to colonoscopy prep date. Miralax- Dissolve one capful in 8 ounces of water and drink before bed as needed. Desitin: Apply a small amount to the external and internal anal area three times a day as needed.  It was great seeing you today! Thank you for entrusting me with your care and choosing [facility].  Noralyn Pick, CRNP  The [facility] providers would like to encourage you to use [facility] to communicate with providers for non-urgent requests or questions.  Due to long hold times on the telephone, sending your provider a message by [facility] may be faster and more efficient way to get a response. Please allow 48 business hours for a response.  Please remember that this is for non-urgent  requests/questions. If you are age 64 or older, your body mass index should be between 23-30. Your Body mass index is 26.44 kg/m. If this is out of the aforementioned range listed, please consider follow up with your Primary Care Provider.  If you are age 64 or younger, your body mass index should be between 19-25. Your Body mass index is 26.44 kg/m. If this is out of the aformentioned range listed, please consider follow up with your Primary Care Provider.

## 2021-02-20 NOTE — Progress Notes (Signed)
Addendum: Reviewed and agree with assessment and management plan. Jarriel Papillion M, MD  

## 2021-03-02 ENCOUNTER — Encounter: Payer: Self-pay | Admitting: Internal Medicine

## 2021-03-05 ENCOUNTER — Other Ambulatory Visit: Payer: Self-pay

## 2021-03-05 ENCOUNTER — Ambulatory Visit (AMBULATORY_SURGERY_CENTER): Payer: BC Managed Care – PPO | Admitting: Internal Medicine

## 2021-03-05 ENCOUNTER — Encounter: Payer: Self-pay | Admitting: Internal Medicine

## 2021-03-05 VITALS — BP 127/54 | HR 66 | Temp 97.5°F | Resp 17 | Ht 59.0 in | Wt 132.0 lb

## 2021-03-05 DIAGNOSIS — K625 Hemorrhage of anus and rectum: Secondary | ICD-10-CM

## 2021-03-05 DIAGNOSIS — D120 Benign neoplasm of cecum: Secondary | ICD-10-CM | POA: Diagnosis not present

## 2021-03-05 DIAGNOSIS — D122 Benign neoplasm of ascending colon: Secondary | ICD-10-CM | POA: Diagnosis not present

## 2021-03-05 DIAGNOSIS — K642 Third degree hemorrhoids: Secondary | ICD-10-CM | POA: Diagnosis not present

## 2021-03-05 MED ORDER — SODIUM CHLORIDE 0.9 % IV SOLN: 500.0000 mL | INTRAVENOUS | Status: DC

## 2021-03-05 NOTE — Patient Instructions (Signed)
Handout on polyps given. ° °YOU HAD AN ENDOSCOPIC PROCEDURE TODAY AT THE [facility]:   Refer to the procedure report that was given to you for any specific questions about what was found during the examination.  If the procedure report does not answer your questions, please call your gastroenterologist to clarify.  If you requested that your care partner not be given the details of your procedure findings, then the procedure report has been included in a sealed envelope for you to review at your convenience later. ° °YOU SHOULD EXPECT: Some feelings of bloating in the abdomen. Passage of more gas than usual.  Walking can help get rid of the air that was put into your GI tract during the procedure and reduce the bloating. If you had a lower endoscopy (such as a colonoscopy or flexible sigmoidoscopy) you may notice spotting of blood in your stool or on the toilet paper. If you underwent a bowel prep for your procedure, you may not have a normal bowel movement for a few days. ° °Please Note:  You might notice some irritation and congestion in your nose or some drainage.  This is from the oxygen used during your procedure.  There is no need for concern and it should clear up in a day or so. ° °SYMPTOMS TO REPORT IMMEDIATELY: ° °Following lower endoscopy (colonoscopy or flexible sigmoidoscopy): ° Excessive amounts of blood in the stool ° Significant tenderness or worsening of abdominal pains ° Swelling of the abdomen that is new, acute ° Fever of 100°F or higher ° °For urgent or emergent issues, a gastroenterologist can be reached at any hour by calling [phone]. °Do not use MyChart messaging for urgent concerns.  ° ° °DIET:  We do recommend a small meal at first, but then you may proceed to your regular diet.  Drink plenty of fluids but you should avoid alcoholic beverages for 24 hours. ° °ACTIVITY:  You should plan to take it easy for the rest of today and you should NOT DRIVE or use heavy machinery  until tomorrow (because of the sedation medicines used during the test).   ° °FOLLOW UP: °Our staff will call the number listed on your records 48-72 hours following your procedure to check on you and address any questions or concerns that you may have regarding the information given to you following your procedure. If we do not reach you, we will leave a message.  We will attempt to reach you two times.  During this call, we will ask if you have developed any symptoms of COVID 19. If you develop any symptoms (ie: fever, flu-like symptoms, shortness of breath, cough etc.) before then, please call [phone].  If you test positive for Covid 19 in the 2 weeks post procedure, please call and report this information to us.   ° °If any biopsies were taken you will be contacted by phone or by letter within the next 1-3 weeks.  Please call us at [phone] if you have not heard about the biopsies in 3 weeks.  ° ° °SIGNATURES/CONFIDENTIALITY: °You and/or your care partner have signed paperwork which will be entered into your electronic medical record.  These signatures attest to the fact that that the information above on your After Visit Summary has been reviewed and is understood.  Full responsibility of the confidentiality of this discharge information lies with you and/or your care-partner.  °

## 2021-03-05 NOTE — Progress Notes (Signed)
Please see office note dated 02/15/2021 for details Patient seen by Carl Best, NP on 02/15/2021 and arranged colonoscopy to evaluate rectal bleeding.  Also change in bowel habit.  She remains appropriate for colonoscopy today.

## 2021-03-05 NOTE — Progress Notes (Signed)
Called to room to assist during endoscopic procedure.  Patient ID and intended procedure confirmed with present staff. Received instructions for my participation in the procedure from the performing physician.  

## 2021-03-05 NOTE — Progress Notes (Signed)
To pacu, VSS. Report to [email or web address].tb 

## 2021-03-05 NOTE — Progress Notes (Signed)
VS by CNW

## 2021-03-05 NOTE — Op Note (Signed)
[facility] Patient Name: Kristen Willis Procedure Date: 03/05/2021 7:17 AM MRN: [id number] Endoscopist: Jerene Bears , MD Age: 66 Referring MD:  Date of Birth: 08-Apr-1955 Gender: Female Account #: [id number] Procedure:                Colonoscopy Indications:              Rectal bleeding, last colon Oct 2013 with Dr. Olevia Perches Medicines:                Monitored Anesthesia Care Procedure:                Pre-Anesthesia Assessment:                           - Prior to the procedure, a History and Physical                            was performed, and patient medications and                            allergies were reviewed. The patient's tolerance of                            previous anesthesia was also reviewed. The risks                            and benefits of the procedure and the sedation                            options and risks were discussed with the patient.                            All questions were answered, and informed consent                            was obtained. Prior Anticoagulants: The patient has                            taken no previous anticoagulant or antiplatelet                            agents. ASA Grade Assessment: II - A patient with                            mild systemic disease. After reviewing the risks                            and benefits, the patient was deemed in                            satisfactory condition to undergo the procedure.                           After obtaining informed consent, the colonoscope  was passed under direct vision. Throughout the                            procedure, the patient's blood pressure, pulse, and                            oxygen saturations were monitored continuously. The                            Olympus PCF-[id number] ([id number]) Colonoscope was                            introduced through the anus and advanced to the                            cecum,  identified by appendiceal orifice and                            ileocecal valve. The colonoscopy was performed                            without difficulty. The patient tolerated the                            procedure well. The quality of the bowel                            preparation was excellent. The ileocecal valve,                            appendiceal orifice, and rectum were photographed. Scope In: 8:13:05 AM Scope Out: 8:32:36 AM Scope Withdrawal Time: 0 hours 13 minutes 52 seconds  Total Procedure Duration: 0 hours 19 minutes 31 seconds  Findings:                 Hemorrhoids were found on perianal exam.                           A 4 mm polyp was found in the cecum. The polyp was                            sessile. The polyp was removed with a cold snare.                            Resection and retrieval were complete.                           Two sessile polyps were found in the ascending                            colon. The polyps were 4 to 7 mm in size. These                            polyps were removed  with a cold snare. Resection                            and retrieval were complete.                           Internal hemorrhoids were found during retroflexion                            and during digital exam. The hemorrhoids were                            medium-sized and Grade III (internal hemorrhoids                            that prolapse but require manual reduction). Complications:            No immediate complications. Estimated Blood Loss:     Estimated blood loss was minimal. Impression:               - One 4 mm polyp in the cecum, removed with a cold                            snare. Resected and retrieved.                           - Two 4 to 7 mm polyps in the ascending colon,                            removed with a cold snare. Resected and retrieved.                           - Internal hemorrhoids. This is the source of                             rectal bleeding. Recommendation:           - Patient has a contact number available for                            emergencies. The signs and symptoms of potential                            delayed complications were discussed with the                            patient. Return to normal activities tomorrow.                            Written discharge instructions were provided to the                            patient.                           - Resume previous diet.                           -  Continue present medications.                           - Hemorrhoidal banding would like significantly                            improve symptoms. The other option would be                            surgical hemorrhoidectomy.                           - Await pathology results.                           - Repeat colonoscopy is recommended for                            surveillance. The colonoscopy date will be                            determined after pathology results from today's                            exam become available for review. Jerene Bears, MD 03/05/2021 8:39:22 AM This report has been signed electronically.

## 2021-03-09 NOTE — Patient Instructions (Addendum)
1. Seasonal and perennial allergic rhinitis (skin testing 01/20/21 positive [id number], ragweed, weeds, trees, indoor molds, cat, dog, and cockroach) - Continue taking: Xyzal (levocetirizine) 2.5mg  tablet once daily and Ryaltris (olopatadine/mometasone) two sprays per nostril 1-2 times daily as needed - You can use an extra dose of the antihistamine, if needed, for breakthrough symptoms.  - Consider nasal saline rinses 1-2 times daily to remove allergens from the nasal cavities as well as help with mucous clearance (this is especially helpful to do before the nasal sprays are given) - Some people like to alternate antihistamines, changing every month or so to maintain efficacy. - Consider allergy shots as a means of long-term control. - Allergy shots "re-train" and "reset" the immune system to ignore environmental allergens and decrease the resulting immune response to those allergens (sneezing, itchy watery eyes, runny nose, nasal congestion, etc).    - Allergy shots improve symptoms in 75-85% of patients.  - We can discuss more at the next appointment if the medications are not working for you. - You can also check with your insurance company to check on coverage.  Schedule a follow up appointment in 6 months or sooner if needed     Reducing Pollen Exposure  The American Academy of Allergy, Asthma and Immunology suggests the following steps to reduce your exposure to pollen during allergy seasons.    Do not hang sheets or clothing out to dry; pollen may collect on these items. Do not mow lawns or spend time around freshly cut grass; mowing stirs up pollen. Keep windows closed at night.  Keep car windows closed while driving. Minimize morning activities outdoors, a time when pollen counts are usually at their highest. Stay indoors as much as possible when pollen counts or humidity is high and on windy days when pollen tends to remain in the air longer. Use air conditioning when possible.   Many air conditioners have filters that trap the pollen spores. Use a HEPA room air filter to remove pollen form the indoor air you breathe.  Control of Mold Allergen   Mold and fungi can grow on a variety of surfaces provided certain temperature and moisture conditions exist.  Outdoor molds grow on plants, decaying vegetation and soil.  The major outdoor mold, Alternaria and Cladosporium, are found in very high numbers during hot and dry conditions.  Generally, a late Summer - Fall peak is seen for common outdoor fungal spores.  Rain will temporarily lower outdoor mold spore count, but counts rise rapidly when the rainy period ends.  The most important indoor molds are Aspergillus and Penicillium.  Dark, humid and poorly ventilated basements are ideal sites for mold growth.  The next most common sites of mold growth are the bathroom and the kitchen.    Indoor (Perennial) Mold Control   Positive indoor molds via skin testing: Aspergillus, Penicillium, Fusarium, Aureobasidium (Pullulara), and Rhizopus  Maintain humidity below 50%. Clean washable surfaces with 5% bleach solution. Remove sources e.g. contaminated carpets.    Control of Dog or Cat Allergen  Avoidance is the best way to manage a dog or cat allergy. If you have a dog or cat and are allergic to dog or cats, consider removing the dog or cat from the home. If you have a dog or cat but dont want to find it a new home, or if your family wants a pet even though someone in the household is allergic, here are some strategies that may help keep symptoms at bay:  Keep the pet out of your bedroom and restrict it to only a few rooms. Be advised that keeping the dog or cat in only one room will not limit the allergens to that room. Dont pet, hug or kiss the dog or cat; if you do, wash your hands with soap and water. High-efficiency particulate air (HEPA) cleaners run continuously in a bedroom or living room can reduce allergen levels over  time. Regular use of a high-efficiency vacuum cleaner or a central vacuum can reduce allergen levels. Giving your dog or cat a bath at least once a week can reduce airborne allergen.  Control of Cockroach Allergen  Cockroach allergen has been identified as an important cause of acute attacks of asthma, especially in urban settings.  There are fifty-five species of cockroach that exist in the [place], however only three, the [place], [place] species produce allergen that can affect patients with Asthma.  Allergens can be obtained from fecal particles, egg casings and secretions from cockroaches.    Remove food sources. Reduce access to water. Seal access and entry points. Spray runways with 0.5-1% Diazinon or Chlorpyrifos Blow boric acid power under stoves and refrigerator. Place bait stations (hydramethylnon) at feeding sites.  Marland Kitchen

## 2021-03-10 ENCOUNTER — Ambulatory Visit (INDEPENDENT_AMBULATORY_CARE_PROVIDER_SITE_OTHER): Payer: BC Managed Care – PPO | Admitting: Family

## 2021-03-10 ENCOUNTER — Telehealth: Payer: Self-pay

## 2021-03-10 ENCOUNTER — Encounter: Payer: Self-pay | Admitting: Family

## 2021-03-10 ENCOUNTER — Encounter: Payer: Self-pay | Admitting: Internal Medicine

## 2021-03-10 ENCOUNTER — Other Ambulatory Visit: Payer: Self-pay

## 2021-03-10 VITALS — BP 138/78 | HR 72 | Temp 97.4°F | Resp 18 | Ht 59.5 in | Wt 130.8 lb

## 2021-03-10 DIAGNOSIS — J302 Other seasonal allergic rhinitis: Secondary | ICD-10-CM

## 2021-03-10 DIAGNOSIS — J3089 Other allergic rhinitis: Secondary | ICD-10-CM

## 2021-03-10 NOTE — Progress Notes (Signed)
[address] [place] [address] Dept: [phone]  FOLLOW UP NOTE  Patient ID: Kristen Willis, female    DOB: 1955-04-30  Age: 66 y.o. MRN: [id number] Date of Office Visit: 03/10/2021  Assessment  Chief Complaint: Seasonal and Perennial Allergic Rhinitis (6 wk f/u - Patient states Xyzal works great! She does takes only half due to it makes her very sleepy.)  HPI Kristen Willis is a 66 year old female who presents today for follow-up of seasonal and perennial allergic rhinitis.  She was last seen on January 20, 2021 by Dr. Ernst Bowler.  Since her last office visit she reports no new diagnosis but reports that she had a colonoscopy last Friday where 3 polyps were found but reports the pathology was negative.  Seasonal and perennial allergic rhinitis is reported as moderately controlled with Xyzal 2.5 mg once a day and Ryaltris nasal spray as needed.  She reports that the Xyzal works great, but she has to cut it in half because 5 mg of Xyzal makes her sleepy.  She reports some clear rhinorrhea and minimal postnasal drip.  She denies nasal congestion.  She denies any sinus infections since we last saw her.  She feels like the postnasal drip is more related to the weather.   Drug Allergies:  Allergies  Allergen Reactions   Erythromycin Other (See Comments)    Stomach cramps/nausea    Review of Systems: Review of Systems  Constitutional:  Negative for chills and fever.  HENT:         Reports some clear rhinorrhea and minimal postnasal drip.  Denies nasal congestion  Eyes:        Denies itchy watery eyes  Respiratory:  Negative for cough, shortness of breath and wheezing.   Cardiovascular:  Negative for chest pain and palpitations.  Gastrointestinal:        Denies heartburn or reflux symptoms  Genitourinary:  Negative for frequency.  Skin:  Negative for itching and rash.  Neurological:  Negative for headaches.  Endo/Heme/Allergies:  Positive for environmental  allergies.    Physical Exam: BP 138/78    Pulse 72    Temp (!) 97.4 F (36.3 C)    Resp 18    Ht 4' 11.5" (1.511 m)    Wt 130 lb 12.8 oz (59.3 kg)    LMP 09/21/2011    SpO2 99%    BMI 25.98 kg/m    Physical Exam Constitutional:      Appearance: Normal appearance.  HENT:     Head: Normocephalic and atraumatic.     Comments: Pharynx normal, eyes normal, ears normal, nose normal    Right Ear: Tympanic membrane, ear canal and external ear normal.     Left Ear: Tympanic membrane, ear canal and external ear normal.     Nose: Nose normal.     Mouth/Throat:     Mouth: Mucous membranes are moist.     Pharynx: Oropharynx is clear.  Eyes:     Conjunctiva/sclera: Conjunctivae normal.  Cardiovascular:     Rate and Rhythm: Normal rate and regular rhythm.     Heart sounds: Normal heart sounds.  Pulmonary:     Effort: Pulmonary effort is normal.     Breath sounds: Normal breath sounds.     Comments: Clear to auscultation Musculoskeletal:     Cervical back: Neck supple.  Skin:    General: Skin is warm.  Neurological:     Mental Status: She is alert and oriented to person,  place, and time.  Psychiatric:        Mood and Affect: Mood normal.        Behavior: Behavior normal.        Thought Content: Thought content normal.        Judgment: Judgment normal.    Diagnostics: None  Assessment and Plan: 1. Seasonal and perennial allergic rhinitis     No orders of the defined types were placed in this encounter.   Patient Instructions  1. Seasonal and perennial allergic rhinitis (skin testing 01/20/21 positive [id number], ragweed, weeds, trees, indoor molds, cat, dog, and cockroach) - Continue taking: Xyzal (levocetirizine) 2.5mg  tablet once daily and Ryaltris (olopatadine/mometasone) two sprays per nostril 1-2 times daily as needed - You can use an extra dose of the antihistamine, if needed, for breakthrough symptoms.  - Consider nasal saline rinses 1-2 times daily to remove allergens  from the nasal cavities as well as help with mucous clearance (this is especially helpful to do before the nasal sprays are given) - Some people like to alternate antihistamines, changing every month or so to maintain efficacy. - Consider allergy shots as a means of long-term control. - Allergy shots "re-train" and "reset" the immune system to ignore environmental allergens and decrease the resulting immune response to those allergens (sneezing, itchy watery eyes, runny nose, nasal congestion, etc).    - Allergy shots improve symptoms in 75-85% of patients.  - We can discuss more at the next appointment if the medications are not working for you. - You can also check with your insurance company to check on coverage.  Schedule a follow up appointment in 6 months or sooner if needed     Reducing Pollen Exposure  The American Academy of Allergy, Asthma and Immunology suggests the following steps to reduce your exposure to pollen during allergy seasons.    Do not hang sheets or clothing out to dry; pollen may collect on these items. Do not mow lawns or spend time around freshly cut grass; mowing stirs up pollen. Keep windows closed at night.  Keep car windows closed while driving. Minimize morning activities outdoors, a time when pollen counts are usually at their highest. Stay indoors as much as possible when pollen counts or humidity is high and on windy days when pollen tends to remain in the air longer. Use air conditioning when possible.  Many air conditioners have filters that trap the pollen spores. Use a HEPA room air filter to remove pollen form the indoor air you breathe.  Control of Mold Allergen   Mold and fungi can grow on a variety of surfaces provided certain temperature and moisture conditions exist.  Outdoor molds grow on plants, decaying vegetation and soil.  The major outdoor mold, Alternaria and Cladosporium, are found in very high numbers during hot and dry conditions.   Generally, a late Summer - Fall peak is seen for common outdoor fungal spores.  Rain will temporarily lower outdoor mold spore count, but counts rise rapidly when the rainy period ends.  The most important indoor molds are Aspergillus and Penicillium.  Dark, humid and poorly ventilated basements are ideal sites for mold growth.  The next most common sites of mold growth are the bathroom and the kitchen.    Indoor (Perennial) Mold Control   Positive indoor molds via skin testing: Aspergillus, Penicillium, Fusarium, Aureobasidium (Pullulara), and Rhizopus  Maintain humidity below 50%. Clean washable surfaces with 5% bleach solution. Remove sources e.g. contaminated carpets.  Control of Dog or Cat Allergen  Avoidance is the best way to manage a dog or cat allergy. If you have a dog or cat and are allergic to dog or cats, consider removing the dog or cat from the home. If you have a dog or cat but dont want to find it a new home, or if your family wants a pet even though someone in the household is allergic, here are some strategies that may help keep symptoms at bay:  Keep the pet out of your bedroom and restrict it to only a few rooms. Be advised that keeping the dog or cat in only one room will not limit the allergens to that room. Dont pet, hug or kiss the dog or cat; if you do, wash your hands with soap and water. High-efficiency particulate air (HEPA) cleaners run continuously in a bedroom or living room can reduce allergen levels over time. Regular use of a high-efficiency vacuum cleaner or a central vacuum can reduce allergen levels. Giving your dog or cat a bath at least once a week can reduce airborne allergen.  Control of Cockroach Allergen  Cockroach allergen has been identified as an important cause of acute attacks of asthma, especially in urban settings.  There are fifty-five species of cockroach that exist in the [place], however only three, the [place], [place] species produce allergen that can affect patients with Asthma.  Allergens can be obtained from fecal particles, egg casings and secretions from cockroaches.    Remove food sources. Reduce access to water. Seal access and entry points. Spray runways with 0.5-1% Diazinon or Chlorpyrifos Blow boric acid power under stoves and refrigerator. Place bait stations (hydramethylnon) at feeding sites.  .          Return in about 6 months (around 09/07/2021), or if symptoms worsen or fail to improve.    Thank you for the opportunity to care for this patient.  Please do not hesitate to contact me with questions.  Althea Charon, FNP [facility]

## 2021-03-10 NOTE — Telephone Encounter (Signed)
°  Follow up Call-  Call back number 03/05/2021  Post procedure Call Back phone  # [phone]  Permission to leave phone message Yes  Some recent data might be hidden     Patient questions:  Do you have a fever, pain , or abdominal swelling? No. Pain Score  0 *  Have you tolerated food without any problems? Yes.    Have you been able to return to your normal activities? Yes.    Do you have any questions about your discharge instructions: Diet   No. Medications  No. Follow up visit  No.  Do you have questions or concerns about your Care? No.  Actions: * If pain score is 4 or above: No action needed, pain <4.  Have you developed a fever since your procedure? no  2.   Have you had an respiratory symptoms (SOB or cough) since your procedure? no  3.   Have you tested positive for COVID 19 since your procedure no  4.   Have you had any family members/close contacts diagnosed with the COVID 19 since your procedure?  no   If yes to any of these questions please route to Joylene John, RN and Joella Prince, RN

## 2021-03-16 ENCOUNTER — Other Ambulatory Visit (HOSPITAL_COMMUNITY): Payer: Self-pay | Admitting: Adult Health

## 2021-03-16 DIAGNOSIS — Z1231 Encounter for screening mammogram for malignant neoplasm of breast: Secondary | ICD-10-CM

## 2021-03-18 ENCOUNTER — Ambulatory Visit (HOSPITAL_COMMUNITY)
Admission: RE | Admit: 2021-03-18 | Discharge: 2021-03-18 | Disposition: A | Discharge status: Home / Self Care | Payer: BC Managed Care – PPO | Source: Ambulatory Visit | Attending: Adult Health | Admitting: Adult Health

## 2021-03-18 ENCOUNTER — Other Ambulatory Visit: Payer: Self-pay

## 2021-03-18 DIAGNOSIS — Z1231 Encounter for screening mammogram for malignant neoplasm of breast: Secondary | ICD-10-CM | POA: Insufficient documentation

## 2021-03-31 ENCOUNTER — Telehealth: Payer: Self-pay

## 2021-03-31 NOTE — Telephone Encounter (Signed)
I have never done anything like this. I am routing to Vance Gather to see what she knows.  ? ?Salvatore Marvel, MD ?[facility] [place] [facility] ? ? ?

## 2021-03-31 NOTE — Telephone Encounter (Signed)
Patient called stating her insurance is requiring a Letter of Medical Necessity Listing the Specific Medical Condition for why she was treated. Im not sure who should handle this. ? ?Please advise  ?

## 2021-04-20 NOTE — Telephone Encounter (Signed)
Has this been taken care of ?  ?Thanks  ? ? ?

## 2021-04-29 ENCOUNTER — Encounter: Payer: Self-pay | Admitting: Internal Medicine

## 2021-05-05 DIAGNOSIS — E782 Mixed hyperlipidemia: Secondary | ICD-10-CM | POA: Diagnosis not present

## 2021-05-05 DIAGNOSIS — Z6824 Body mass index (BMI) 24.0-24.9, adult: Secondary | ICD-10-CM | POA: Diagnosis not present

## 2021-05-05 DIAGNOSIS — H6690 Otitis media, unspecified, unspecified ear: Secondary | ICD-10-CM | POA: Diagnosis not present

## 2021-05-24 ENCOUNTER — Encounter: Payer: Self-pay | Admitting: *Deleted

## 2021-05-25 ENCOUNTER — Ambulatory Visit (INDEPENDENT_AMBULATORY_CARE_PROVIDER_SITE_OTHER): Payer: BC Managed Care – PPO | Admitting: Internal Medicine

## 2021-05-25 ENCOUNTER — Encounter: Payer: Self-pay | Admitting: Internal Medicine

## 2021-05-25 VITALS — BP 119/60 | HR 95 | Ht 59.0 in | Wt 127.0 lb

## 2021-05-25 DIAGNOSIS — K648 Other hemorrhoids: Secondary | ICD-10-CM | POA: Diagnosis not present

## 2021-05-25 NOTE — Progress Notes (Signed)
Kristen Willis is a 66 year old female with a history of adenomatous colon polyps and symptomatic internal hemorrhoids who presents for hemorrhoidal banding. ? ?Known to me from colonoscopy performed in February 2023.  She had 3 subcentimeter adenomatous polyps removed.  There were medium size grade 3 internal hemorrhoids found on this exam. ? ?She has had symptomatic internal hemorrhoids for a number of years. ?Symptoms include: Regular rectal bleeding painless in nature, prolapse and fecal smearing ? ?No prior hemorrhoidal treatment. ?Inconsistent bowel movements despite Benefiber 2 teaspoons daily, high-fiber diet high water intake and recently started Activia yogurt ? ? ?PROCEDURE NOTE: ? ?The patient presents with symptomatic grade 3 internal hemorrhoids, requesting rubber band ligation of her hemorrhoidal disease.  All risks, benefits and alternative forms of therapy were described and informed consent was obtained. ? ? ?The anorectum was pre-medicated with 0.125% nitroglycerin ointment ?The decision was made to band the LL internal hemorrhoid, and the Benton O?Regan System was used to perform band ligation without complication.  ? ?Digital anorectal examination was then performed to assure proper positioning of the band, and to adjust the banded tissue as required. ? ?The patient was discharged home without pain or other issues.  Dietary and behavioral recommendations were given and along with follow-up instructions.   ?  ?The following adjunctive treatments were recommended: ?Continue high-fiber diet, Benefiber and liberal fluid intake ?Consider stool softener in the future if stools remain hard and inconsistent ? ?The patient will return as scheduled for follow-up and possible additional banding as required. ?No complications were encountered and the patient tolerated the procedure well. ? ?

## 2021-05-25 NOTE — Patient Instructions (Signed)
HEMORRHOID BANDING PROCEDURE  ? ? FOLLOW-UP CARE ? ? ?The procedure you have had should have been relatively painless since the banding of the area involved does not have nerve endings and there is no pain sensation.  The rubber band cuts off the blood supply to the hemorrhoid and the band may fall off as soon as 48 hours after the banding (the band may occasionally be seen in the toilet bowl following a bowel movement). You may notice a temporary feeling of fullness in the rectum which should respond adequately to plain Tylenol? or Motrin?. ? ?Following the banding, avoid strenuous exercise that evening and resume full activity the next day.  A sitz bath (soaking in a warm tub) or bidet is soothing, and can be useful for cleansing the area after bowel movements.   ? ? ?To avoid constipation, take two tablespoons of natural wheat bran, natural oat bran, flax, Benefiber? or any over the counter fiber supplement and increase your water intake to 7-8 glasses daily.   ? ?Unless you have been prescribed anorectal medication, do not put anything inside your rectum for two weeks: No suppositories, enemas, fingers, etc. ? ?Occasionally, you may have more bleeding than usual after the banding procedure.  This is often from the untreated hemorrhoids rather than the treated one.  Don?t be concerned if there is a tablespoon or so of blood.  If there is more blood than this, lie flat with your bottom higher than your head and apply an ice pack to the area. If the bleeding does not stop within a half an hour or if you feel faint, call our office at [phone] or go to the emergency room. ? ?Problems are not common; however, if there is a substantial amount of bleeding, severe pain, chills, fever or difficulty passing urine (very rare) or other problems, you should call us at [phone] or report to the nearest emergency room. ? ?Do not stay seated continuously for more than 2-3 hours for a day or two after the procedure.   Tighten your buttock muscles 10-15 times every two hours and take 10-15 deep breaths every 1-2 hours.  Do not spend more than a few minutes on the toilet if you cannot empty your bowel; instead re-visit the toilet at a later time. ? ?  ? ?I appreciate the  opportunity to care for you ? ?Thank You  ? ?Zenovia Jarred, MD  ?

## 2021-06-07 ENCOUNTER — Other Ambulatory Visit: Payer: BC Managed Care – PPO | Admitting: Adult Health

## 2021-06-10 ENCOUNTER — Telehealth: Payer: Self-pay | Admitting: Internal Medicine

## 2021-06-10 ENCOUNTER — Other Ambulatory Visit: Payer: BC Managed Care – PPO | Admitting: Adult Health

## 2021-06-10 NOTE — Telephone Encounter (Signed)
Banding has been rescheduled to 08/05/21 at 340 pm as per patient request.

## 2021-06-10 NOTE — Telephone Encounter (Signed)
Inbound call from patient stating she needed to cancel her appointments for 6/7 at 11:00 and 6/27 both for hem banding due to scheduling conflicts. Patient is requesting a call back to discuss. Please advise.

## 2021-06-23 ENCOUNTER — Encounter: Payer: BC Managed Care – PPO | Admitting: Internal Medicine

## 2021-07-13 ENCOUNTER — Encounter: Payer: BC Managed Care – PPO | Admitting: Internal Medicine

## 2021-07-14 ENCOUNTER — Other Ambulatory Visit: Payer: BC Managed Care – PPO | Admitting: Adult Health

## 2021-07-26 DIAGNOSIS — H43812 Vitreous degeneration, left eye: Secondary | ICD-10-CM | POA: Diagnosis not present

## 2021-07-28 ENCOUNTER — Encounter: Payer: Self-pay | Admitting: Adult Health

## 2021-07-28 ENCOUNTER — Other Ambulatory Visit (HOSPITAL_COMMUNITY)
Admission: RE | Admit: 2021-07-28 | Discharge: 2021-07-28 | Disposition: A | Discharge status: Home / Self Care | Payer: BC Managed Care – PPO | Source: Ambulatory Visit | Attending: Adult Health | Admitting: Adult Health

## 2021-07-28 ENCOUNTER — Ambulatory Visit (INDEPENDENT_AMBULATORY_CARE_PROVIDER_SITE_OTHER): Payer: BC Managed Care – PPO | Admitting: Adult Health

## 2021-07-28 VITALS — BP 113/63 | HR 71 | Ht 59.0 in | Wt 125.5 lb

## 2021-07-28 DIAGNOSIS — Z01419 Encounter for gynecological examination (general) (routine) without abnormal findings: Secondary | ICD-10-CM | POA: Diagnosis not present

## 2021-07-28 DIAGNOSIS — Z1211 Encounter for screening for malignant neoplasm of colon: Secondary | ICD-10-CM | POA: Diagnosis not present

## 2021-07-28 DIAGNOSIS — K649 Unspecified hemorrhoids: Secondary | ICD-10-CM | POA: Diagnosis not present

## 2021-07-28 LAB — HEMOCCULT GUIAC POC 1CARD (OFFICE): Fecal Occult Blood, POC: NEGATIVE

## 2021-07-28 NOTE — Progress Notes (Signed)
Patient ID: Kristen Willis, female   DOB: Feb 20, 1955, 66 y.o.   MRN: [id number] History of Present Illness: Kristen Willis is a 66 year old white female, married, PM in for a well woman gyn exam and pap She is complaining of hemorroids, has had banding once and will bleed and loose mucous. PCP is Dr Hilma Favors.  Current Medications, Allergies, Past Medical History, Past Surgical History, Family History and Social History were reviewed in [employer] record.     Review of Systems: Patient denies any headaches, hearing loss, fatigue, blurred vision, shortness of breath, chest pain, abdominal pain, problems with bowel movements, urination, or intercourse. No joint pain or mood swings.  She denies any vaginal bleeding +hemorrhoids   Physical Exam:BP 113/63 (BP Location: Left Arm, Patient Position: Sitting, Cuff Size: Normal)   Pulse 71   Ht '4\' 11"'$  (1.499 m)   Wt 125 lb 8 oz (56.9 kg)   LMP 09/21/2011   BMI 25.35 kg/m   General:  Well developed, well nourished, no acute distress Skin:  Warm and dry Neck:  Midline trachea, normal thyroid, good ROM, no lymphadenopathy,no carotid bruits heard Lungs; Clear to auscultation bilaterally Breast:  No dominant palpable mass, retraction, or nipple discharge Cardiovascular: Regular rate and rhythm Abdomen:  Soft, non tender, no hepatosplenomegaly Pelvic:  External genitalia is normal in appearance, no lesions.  The vagina is pale.  Urethra has no lesions or masses. The cervix is smooth, pap with HR  HPV genotyping performed.  Uterus is felt to be normal size, shape, and contour.  No adnexal masses or tenderness noted.Bladder is non tender, no masses felt. Rectal: Good sphincter tone, no polyps, + external and internal  hemorrhoids felt.  Hemoccult negative. Extremities/musculoskeletal:  No swelling or varicosities noted, no clubbing or cyanosis Psych:  No mood changes, alert and cooperative,seems happy AA is 2 Fall risk is low     07/28/2021    8:51 AM 06/03/2020    8:36 AM 04/06/2018    8:39 AM  Depression screen PHQ 2/9  Decreased Interest 0 0 0  Down, Depressed, Hopeless 1 0 0  PHQ - 2 Score 1 0 0  Altered sleeping 1 0   Tired, decreased energy 1 0   Change in appetite 0 0   Feeling bad or failure about yourself  0 0   Trouble concentrating 0 0   Moving slowly or fidgety/restless 0 0   Suicidal thoughts 0 0   PHQ-9 Score 3 0        07/28/2021    8:51 AM 06/03/2020    8:36 AM  GAD 7 : Generalized Anxiety Score  Nervous, Anxious, on Edge 1 0  Control/stop worrying 0 0  Worry too much - different things 0 0  Trouble relaxing 0 0  Restless 0 0  Easily annoyed or irritable 0 0  Afraid - awful might happen 0 0  Total GAD 7 Score 1 0      Upstream - 07/28/21 0854       Pregnancy Intention Screening   Does the patient want to become pregnant in the next year? N/A    Does the patient's partner want to become pregnant in the next year? N/A    Would the patient like to discuss contraceptive options today? N/A      Contraception Wrap Up   Current Method Female Sterilization   postmenopausal   End Method Female Sterilization   postmenopausal   Contraception Counseling Provided No  Examination chaperoned by Levy Pupa LPN  Impression and Plan: 1. Encounter for gynecological examination with Papanicolaou smear of cervix Pap sent Physical in 1 year Pap in 3 years if normal Labs with PCP Colonoscopy 2028 Mammogram normal 03/18/21  2. Encounter for screening fecal occult blood testing Hemoccult was negative  3. Hemorrhoids, unspecified hemorrhoid type Called in Hemorrhoid cream to be compounded at [employer] doughnut pillow for work chair Follow up with GI, if wants more banding

## 2021-07-30 LAB — CYTOLOGY - PAP
Comment: NEGATIVE
Diagnosis: NEGATIVE
High risk HPV: NEGATIVE

## 2021-08-05 ENCOUNTER — Encounter: Payer: BC Managed Care – PPO | Admitting: Internal Medicine

## 2021-08-11 DIAGNOSIS — H43812 Vitreous degeneration, left eye: Secondary | ICD-10-CM | POA: Diagnosis not present

## 2021-09-08 ENCOUNTER — Ambulatory Visit: Payer: BC Managed Care – PPO | Admitting: Family

## 2021-09-30 DIAGNOSIS — E782 Mixed hyperlipidemia: Secondary | ICD-10-CM | POA: Diagnosis not present

## 2021-09-30 DIAGNOSIS — E7849 Other hyperlipidemia: Secondary | ICD-10-CM | POA: Diagnosis not present

## 2021-09-30 DIAGNOSIS — Z6824 Body mass index (BMI) 24.0-24.9, adult: Secondary | ICD-10-CM | POA: Diagnosis not present

## 2021-09-30 DIAGNOSIS — Z23 Encounter for immunization: Secondary | ICD-10-CM | POA: Diagnosis not present

## 2021-09-30 DIAGNOSIS — Z1331 Encounter for screening for depression: Secondary | ICD-10-CM | POA: Diagnosis not present

## 2021-09-30 DIAGNOSIS — Z0000 Encounter for general adult medical examination without abnormal findings: Secondary | ICD-10-CM | POA: Diagnosis not present

## 2021-09-30 DIAGNOSIS — R7309 Other abnormal glucose: Secondary | ICD-10-CM | POA: Diagnosis not present

## 2022-02-23 ENCOUNTER — Ambulatory Visit
Admission: RE | Admit: 2022-02-23 | Discharge: 2022-02-23 | Disposition: A | Discharge status: Home / Self Care | Payer: 59 | Source: Ambulatory Visit | Attending: Nurse Practitioner | Admitting: Nurse Practitioner

## 2022-02-23 VITALS — BP 149/77 | HR 74 | Temp 98.6°F | Resp 16

## 2022-02-23 DIAGNOSIS — H60502 Unspecified acute noninfective otitis externa, left ear: Secondary | ICD-10-CM

## 2022-02-23 MED ORDER — OFLOXACIN 0.3 % OT SOLN: 10.0000 [drp] | Freq: Every day | OTIC | 0 refills | Status: AC

## 2022-02-23 NOTE — ED Provider Notes (Signed)
[facility]    CSN: [id number] Arrival date & time: 02/23/22  0843      History   Chief Complaint Chief Complaint  Patient presents with   Ear Fullness    I've felt like I had fluid in my ear for several days, tried some drops for wax removal, didn't help. Some pain in my ear now - Entered by patient   Appointment    0900    HPI Kristen Willis is a 67 y.o. female.   Patient presents today for 3-week history of pain and feeling like there is fluid in her left ear.  Reports prior to this, she was using eardrops to help break up the wax.  Reports for the past few days, the pain has moved down from her ear into her left sinus cavity.  Reports history of "sinus issues."  Reports she has been taking Astepro which helps minimally.  She has tried allergy medications in the past, however oral antihistamines make her sleepy.  Denies new hearing loss, recent new cough, congestion, or sore throat.  No fever, headache, or ear drainage.    Past Medical History:  Diagnosis Date   Depression    Folliculitis 77/41/2878   HLD (hyperlipidemia)    Hormone replacement therapy (HRT) 12/31/2012   Internal hemorrhoids    Irritable bowel syndrome    PMB (postmenopausal bleeding) 10/27/2014   Rectal bleeding 01/30/2013   Tubular adenoma of colon     Patient Active Problem List   Diagnosis Date Noted   Encounter for gynecological examination with Papanicolaou smear of cervix 07/28/2021   Constipation 02/15/2021   Encounter for screening fecal occult blood testing 06/03/2020   Postmenopausal 06/03/2020   Encounter for well woman exam with routine gynecological exam 01/02/2017   Hot flashes 01/02/2017   PMB (postmenopausal bleeding) 67/67/2094   Folliculitis 70/96/2836   Rectal bleeding 01/30/2013   Hemorrhoids 01/30/2013   Hormone replacement therapy (HRT) 12/31/2012   Left leg pain 12/31/2012   Depression 12/31/2012    Past Surgical History:  Procedure Laterality Date    HEMORRHOID BANDING     05/2021   REFRACTIVE SURGERY Right    [place]   TUBAL LIGATION  01/18/1991   WISDOM TOOTH EXTRACTION     early 20's    OB History     Gravida  2   Para  2   Term  2   Preterm      AB      Living  2      SAB      IAB      Ectopic      Multiple      Live Births  2            Home Medications    Prior to Admission medications   Medication Sig Start Date End Date Taking? Authorizing Provider  ofloxacin (FLOXIN) 0.3 % OTIC solution Place 10 drops into the left ear daily for 7 days. 02/23/22 03/02/22 Yes Eulogio Bear, NP  Ascorbic Acid (VITAMIN C) 1000 MG tablet Take 2,000 mg by mouth daily.    [provider]  Calcium Carb-Cholecalciferol (CALCIUM + D3) 600-200 MG-UNIT TABS Take 1 tablet by mouth daily.    [provider]  cholecalciferol (VITAMIN D) 1000 units tablet Take 2,000 Units by mouth daily.    [provider]  ELDERBERRY PO Take 1 tablet by mouth daily. Gummies    [provider]  hydrocortisone cream 1 %  Apply 1 application  topically as needed.    [provider]  levocetirizine (XYZAL) 5 MG tablet Take 1 tablet (5 mg total) by mouth every evening. Patient taking differently: Take 5 mg by mouth as needed. 01/20/21   Valentina Shaggy, MD  rosuvastatin (CRESTOR) 5 MG tablet Take 5 mg by mouth daily.    [provider]  RYALTRIS [phone] MCG/ACT SUSP Place 2 sprays into the nose 2 (two) times daily as needed. 01/20/21   Valentina Shaggy, MD  vitamin E 400 UNIT capsule Take 400 Units by mouth daily.    [provider]  Wheat Dextrin (BENEFIBER PO) Take by mouth daily.    [provider]    Family History Family History  Problem Relation Age of Onset   Seizures Mother    Dementia Mother    Hypertension Father    Irritable bowel syndrome Father    Allergic rhinitis Brother    Parkinson's disease Brother    Seizures Maternal Grandmother    Heart  disease Maternal Grandmother    Heart attack Maternal Grandfather    Alzheimer's disease Paternal Grandmother    Alcohol abuse Paternal Grandfather        recovering   Emphysema Paternal Uncle    Heart disease Maternal Uncle    Colon cancer Neg Hx    Esophageal cancer Neg Hx    Rectal cancer Neg Hx    Stomach cancer Neg Hx     Social History Social History   Tobacco Use   Smoking status: Former    Packs/day: 0.50    Years: 35.00    Total pack years: 17.50    Types: Cigarettes    Quit date: 02/10/1983    Years since quitting: 39.0   Smokeless tobacco: Never   Tobacco comments:    quit in 1986  Vaping Use   Vaping Use: Never used  Substance Use Topics   Alcohol use: Yes    Comment: 1-2 glasses of wine on the weekends   Drug use: No     Allergies   Erythromycin   Review of Systems Review of Systems Per HPI  Physical Exam Triage Vital Signs ED Triage Vitals  Enc Vitals Group     BP 02/23/22 0923 (!) 149/77     Pulse Rate 02/23/22 0923 74     Resp 02/23/22 0923 16     Temp 02/23/22 0923 98.6 F (37 C)     Temp Source 02/23/22 0923 Oral     SpO2 02/23/22 0923 95 %     Weight --      Height --      Head Circumference --      Peak Flow --      Pain Score 02/23/22 0922 8     Pain Loc --      Pain Edu? --      Excl. in [place]? --    No data found.  Updated Vital Signs BP (!) 149/77 (BP Location: Right Arm)   Pulse 74   Temp 98.6 F (37 C) (Oral)   Resp 16   LMP 09/21/2011   SpO2 95%   Visual Acuity Right Eye Distance:   Left Eye Distance:   Bilateral Distance:    Right Eye Near:   Left Eye Near:    Bilateral Near:     Physical Exam Vitals and nursing note reviewed.  Constitutional:      General: She is not in acute distress.  Appearance: Normal appearance. She is not toxic-appearing.  HENT:     Head: Normocephalic and atraumatic.     Right Ear: Hearing, tympanic membrane, ear canal and external ear normal.     Left Ear: Swelling and  tenderness present. No laceration or drainage.  No middle ear effusion. Tympanic membrane is not injected, scarred, perforated, erythematous, retracted or bulging.     Nose:     Right Sinus: No maxillary sinus tenderness or frontal sinus tenderness.     Left Sinus: No maxillary sinus tenderness or frontal sinus tenderness.     Mouth/Throat:     Mouth: Mucous membranes are moist.     Pharynx: Oropharynx is clear. Posterior oropharyngeal erythema present.     Comments: Cobblestoning of posterior pharynx Eyes:     General: No scleral icterus.    Extraocular Movements: Extraocular movements intact.     Pupils: Pupils are equal, round, and reactive to light.  Cardiovascular:     Rate and Rhythm: Normal rate and regular rhythm.  Pulmonary:     Effort: Pulmonary effort is normal. No respiratory distress.  Musculoskeletal:     Cervical back: Normal range of motion.  Lymphadenopathy:     Cervical: No cervical adenopathy.  Skin:    General: Skin is warm and dry.     Capillary Refill: Capillary refill takes less than 2 seconds.     Coloration: Skin is not jaundiced or pale.     Findings: No erythema.  Neurological:     Mental Status: She is alert and oriented to person, place, and time.  Psychiatric:        Behavior: Behavior is cooperative.      UC Treatments / Results  Labs (all labs ordered are listed, but only abnormal results are displayed) Labs Reviewed - No data to display  EKG   Radiology No results found.  Procedures Procedures (including critical care time)  Medications Ordered in UC Medications - No data to display  Initial Impression / Assessment and Plan / UC Course  I have reviewed the triage vital signs and the nursing notes.  Pertinent labs & imaging results that were available during my care of the patient were reviewed by me and considered in my medical decision making (see chart for details).   Patient is well-appearing, normotensive, afebrile, not  tachycardic, not tachypneic, oxygenating well on room air.   1. Acute otitis externa of left ear, unspecified type Treat with ofloxacin drops daily for 7 days Ear precautions discussed Follow-up with primary care provider or ENT with no improvement or worsening of symptoms despite treatment  The patient was given the opportunity to ask questions.  All questions answered to their satisfaction.  The patient is in agreement to this plan.    Final Clinical Impressions(s) / UC Diagnoses   Final diagnoses:  Acute otitis externa of left ear, unspecified type     Discharge Instructions      As we discussed, the skin in your ear canal is swollen and red which I believe is causing your symptoms.  This is likely a bacterial infection in the skin.  Start using the Ofloxacin drops daily to help with the infection.  Try to avoid getting any water into your ear until the drops are finished.  When you shower, you can put a vaseline-tipped cotton ball in your ear to prevent water from entering.  You can also use the vaseline-tipped cotton ball after using the ear drops so they do not drain  out before they are absorbed.    With no improvement or worsening of symptoms despite treatment, follow up with PCP or ENT.  Contact information for ENT is below.    ED Prescriptions     Medication Sig Dispense Auth. Provider   ofloxacin (FLOXIN) 0.3 % OTIC solution Place 10 drops into the left ear daily for 7 days. 5 mL Eulogio Bear, NP      PDMP not reviewed this encounter.   Eulogio Bear, NP 02/23/22 [phone]

## 2022-02-23 NOTE — Discharge Instructions (Addendum)
As we discussed, the skin in your ear canal is swollen and red which I believe is causing your symptoms.  This is likely a bacterial infection in the skin.  Start using the Ofloxacin drops daily to help with the infection.  Try to avoid getting any water into your ear until the drops are finished.  When you shower, you can put a vaseline-tipped cotton ball in your ear to prevent water from entering.  You can also use the vaseline-tipped cotton ball after using the ear drops so they do not drain out before they are absorbed.    With no improvement or worsening of symptoms despite treatment, follow up with PCP or ENT.  Contact information for ENT is below.

## 2022-02-23 NOTE — ED Triage Notes (Signed)
Pt reports pain and fluid in left ear x 3 weeks after using eardrops for wax; sinus pressure for 2-3 days.

## 2022-05-05 ENCOUNTER — Other Ambulatory Visit (HOSPITAL_COMMUNITY): Payer: Self-pay | Admitting: Adult Health

## 2022-05-05 DIAGNOSIS — Z1231 Encounter for screening mammogram for malignant neoplasm of breast: Secondary | ICD-10-CM

## 2022-05-18 ENCOUNTER — Encounter (HOSPITAL_COMMUNITY): Payer: Self-pay

## 2022-05-18 ENCOUNTER — Ambulatory Visit (HOSPITAL_COMMUNITY)
Admission: RE | Admit: 2022-05-18 | Discharge: 2022-05-18 | Disposition: A | Discharge status: Home / Self Care | Payer: 59 | Source: Ambulatory Visit | Attending: Adult Health | Admitting: Adult Health

## 2022-05-18 ENCOUNTER — Inpatient Hospital Stay (HOSPITAL_COMMUNITY): Admission: RE | Admit: 2022-05-18 | Payer: 59 | Source: Ambulatory Visit

## 2022-05-18 DIAGNOSIS — Z1231 Encounter for screening mammogram for malignant neoplasm of breast: Secondary | ICD-10-CM | POA: Insufficient documentation

## 2022-08-11 ENCOUNTER — Ambulatory Visit (INDEPENDENT_AMBULATORY_CARE_PROVIDER_SITE_OTHER): Payer: 59 | Admitting: Adult Health

## 2022-08-11 ENCOUNTER — Encounter: Payer: Self-pay | Admitting: Adult Health

## 2022-08-11 VITALS — BP 128/71 | HR 72 | Ht 59.0 in | Wt 130.0 lb

## 2022-08-11 DIAGNOSIS — R232 Flushing: Secondary | ICD-10-CM

## 2022-08-11 DIAGNOSIS — K649 Unspecified hemorrhoids: Secondary | ICD-10-CM

## 2022-08-11 DIAGNOSIS — Z01419 Encounter for gynecological examination (general) (routine) without abnormal findings: Secondary | ICD-10-CM | POA: Diagnosis not present

## 2022-08-11 DIAGNOSIS — R350 Frequency of micturition: Secondary | ICD-10-CM

## 2022-08-11 DIAGNOSIS — Z780 Asymptomatic menopausal state: Secondary | ICD-10-CM

## 2022-08-11 DIAGNOSIS — Z1211 Encounter for screening for malignant neoplasm of colon: Secondary | ICD-10-CM | POA: Diagnosis not present

## 2022-08-11 LAB — POCT URINALYSIS DIPSTICK
Blood, UA: NEGATIVE
Glucose, UA: NEGATIVE
Ketones, UA: NEGATIVE
Leukocytes, UA: NEGATIVE
Nitrite, UA: NEGATIVE
Protein, UA: NEGATIVE

## 2022-08-11 LAB — HEMOCCULT GUIAC POC 1CARD (OFFICE): Fecal Occult Blood, POC: NEGATIVE

## 2022-08-11 NOTE — Progress Notes (Signed)
Patient ID: Kristen Willis, female   DOB: 07/01/1955, 67 y.o.   MRN: [id number] History of Present Illness: Kristen Willis is a 67 year old white female,married, PM, in for a well woman gyn exam. She is having urinary frequency on and off and left sciatic like pain does radiate to front at times.  Has started having hot flashes again.  She is still working in [place]. She tries walk on treadmill 30 minutes at work on lunch break.     Component Value Date/Time   DIAGPAP  07/28/2021 0854    - Negative for intraepithelial lesion or malignancy (NILM)   DIAGPAP  04/06/2018 0000    NEGATIVE FOR INTRAEPITHELIAL LESIONS OR MALIGNANCY.   HPVHIGH Negative 07/28/2021 0854   ADEQPAP  07/28/2021 0854    Satisfactory for evaluation; transformation zone component PRESENT.   ADEQPAP  04/06/2018 0000    Satisfactory for evaluation  endocervical/transformation zone component PRESENT.   PCP is Dr Phillips Odor.  Current Medications, Allergies, Past Medical History, Past Surgical History, Family History and Social History were reviewed in [employer] record.     Review of Systems: Patient denies any headaches, hearing loss, fatigue, blurred vision, shortness of breath, chest pain, abdominal pain, problems with bowel movements, (may not go everyday) or intercourse. No joint pain or mood swings.  Denies any vaginal bleeding. See HPI for positives    Physical Exam:BP 128/71 (BP Location: Left Arm, Patient Position: Sitting, Cuff Size: Normal)   Pulse 72   Ht 4\' 11"  (1.499 m)   Wt 130 lb (59 kg)   LMP 09/21/2011   BMI 26.26 kg/m  urine dipstick is negative  General:  Well developed, well nourished, no acute distress Skin:  Warm and dry Neck:  Midline trachea, normal thyroid, good ROM, no lymphadenopathy, no carotid bruits heard Lungs; Clear to auscultation bilaterally Breast:  No dominant palpable mass, retraction, or nipple discharge Cardiovascular: Regular rate and rhythm Abdomen:  Soft,  non tender, no hepatosplenomegaly Pelvic:  External genitalia is normal in appearance, no lesions.  The vagina is pale. Urethra has no lesions or masses. The cervix is smooth. Uterus is felt to be normal size, shape, and contour.  No adnexal masses or tenderness noted.Bladder is non tender, no masses felt. Rectal: Good sphincter tone, no polyps, + hemorrhoids felt.  Hemoccult negative. Extremities/musculoskeletal:  No swelling or varicosities noted, no clubbing or cyanosis Psych:  No mood changes, alert and cooperative,seems happy AA is 2 Fall risk is low    08/11/2022    8:37 AM 07/28/2021    8:51 AM 06/03/2020    8:36 AM  Depression screen PHQ 2/9  Decreased Interest 0 0 0  Down, Depressed, Hopeless 0 1 0  PHQ - 2 Score 0 1 0  Altered sleeping 0 1 0  Tired, decreased energy 1 1 0  Change in appetite 1 0 0  Feeling bad or failure about yourself  0 0 0  Trouble concentrating 0 0 0  Moving slowly or fidgety/restless 0 0 0  Suicidal thoughts 0 0 0  PHQ-9 Score 2 3 0       08/11/2022    8:37 AM 07/28/2021    8:51 AM 06/03/2020    8:36 AM  GAD 7 : Generalized Anxiety Score  Nervous, Anxious, on Edge 1 1 0  Control/stop worrying 0 0 0  Worry too much - different things 1 0 0  Trouble relaxing 0 0 0  Restless 0 0 0  Easily  annoyed or irritable 0 0 0  Afraid - awful might happen 1 0 0  Total GAD 7 Score 3 1 0      Upstream - 08/11/22 0843       Pregnancy Intention Screening   Does the patient want to become pregnant in the next year? N/A    Does the patient's partner want to become pregnant in the next year? N/A    Would the patient like to discuss contraceptive options today? N/A      Contraception Wrap Up   Current Method Female Sterilization    End Method Female Sterilization    Contraception Counseling Provided No            Examination chaperoned by Malachy Mood LPN   Impression and Plan: 1. Urinary frequency Urine was negative  - POCT Urinalysis Dipstick  2.  Encounter for screening fecal occult blood testing Hemoccult was negative  - POCT occult blood stool  3. Hemorrhoids, unspecified hemorrhoid type  4. Postmenopausal Denies any bleeding   5. Encounter for well woman exam with routine gynecological exam Physical in 1 year Pap in 2026 Labs with PCP Mammogram was negative 05/18/22 Colonoscopy per GI   6. Hot flashes Will watch for now

## 2022-10-04 DIAGNOSIS — Z6824 Body mass index (BMI) 24.0-24.9, adult: Secondary | ICD-10-CM | POA: Diagnosis not present

## 2022-10-04 DIAGNOSIS — J309 Allergic rhinitis, unspecified: Secondary | ICD-10-CM | POA: Diagnosis not present

## 2022-10-04 DIAGNOSIS — E782 Mixed hyperlipidemia: Secondary | ICD-10-CM | POA: Diagnosis not present

## 2022-10-04 DIAGNOSIS — Z1331 Encounter for screening for depression: Secondary | ICD-10-CM | POA: Diagnosis not present

## 2022-10-04 DIAGNOSIS — E7849 Other hyperlipidemia: Secondary | ICD-10-CM | POA: Diagnosis not present

## 2022-10-04 DIAGNOSIS — Z23 Encounter for immunization: Secondary | ICD-10-CM | POA: Diagnosis not present

## 2022-10-04 DIAGNOSIS — Z0000 Encounter for general adult medical examination without abnormal findings: Secondary | ICD-10-CM | POA: Diagnosis not present

## 2023-05-17 ENCOUNTER — Other Ambulatory Visit (HOSPITAL_COMMUNITY): Payer: Self-pay | Admitting: Adult Health

## 2023-05-17 DIAGNOSIS — Z1231 Encounter for screening mammogram for malignant neoplasm of breast: Secondary | ICD-10-CM

## 2023-05-26 ENCOUNTER — Encounter (HOSPITAL_COMMUNITY): Payer: Self-pay

## 2023-06-02 ENCOUNTER — Ambulatory Visit (HOSPITAL_COMMUNITY)
Admission: RE | Admit: 2023-06-02 | Discharge: 2023-06-02 | Disposition: A | Discharge status: Home / Self Care | Source: Ambulatory Visit | Attending: Adult Health | Admitting: Adult Health

## 2023-06-02 DIAGNOSIS — Z1231 Encounter for screening mammogram for malignant neoplasm of breast: Secondary | ICD-10-CM | POA: Diagnosis not present

## 2023-06-08 ENCOUNTER — Ambulatory Visit: Payer: Self-pay | Admitting: Adult Health

## 2023-08-16 ENCOUNTER — Ambulatory Visit: Admitting: Adult Health

## 2023-08-16 ENCOUNTER — Encounter: Payer: Self-pay | Admitting: Adult Health

## 2023-08-16 VITALS — BP 135/75 | HR 66 | Ht 59.5 in | Wt 128.5 lb

## 2023-08-16 DIAGNOSIS — Z1331 Encounter for screening for depression: Secondary | ICD-10-CM | POA: Diagnosis not present

## 2023-08-16 DIAGNOSIS — R030 Elevated blood-pressure reading, without diagnosis of hypertension: Secondary | ICD-10-CM | POA: Diagnosis not present

## 2023-08-16 DIAGNOSIS — Z780 Asymptomatic menopausal state: Secondary | ICD-10-CM

## 2023-08-16 DIAGNOSIS — K649 Unspecified hemorrhoids: Secondary | ICD-10-CM

## 2023-08-16 DIAGNOSIS — Z01419 Encounter for gynecological examination (general) (routine) without abnormal findings: Secondary | ICD-10-CM | POA: Diagnosis not present

## 2023-08-16 NOTE — Progress Notes (Addendum)
 Patient ID: Kristen Willis, female   DOB: 19-Oct-1955, 68 y.o.   MRN: [id number] History of Present Illness: Kristen Willis is a 68 year old white female, married, PM in for a well woman gyn exam. She is still working.     Component Value Date/Time   DIAGPAP  07/28/2021 0854    - Negative for intraepithelial lesion or malignancy (NILM)   DIAGPAP  04/06/2018 0000    NEGATIVE FOR INTRAEPITHELIAL LESIONS OR MALIGNANCY.   HPVHIGH Negative 07/28/2021 0854   ADEQPAP  07/28/2021 0854    Satisfactory for evaluation; transformation zone component PRESENT.   ADEQPAP  04/06/2018 0000    Satisfactory for evaluation  endocervical/transformation zone component PRESENT.    PCP is Dr Marvine   Current Medications, Allergies, Past Medical History, Past Surgical History, Family History and Social History were reviewed in [employer] electronic medical record.     Review of Systems: Patient denies any headaches, hearing loss, fatigue, blurred vision, shortness of breath, chest pain, abdominal pain, problems with bowel movements, urination, or intercourse. No joint pain or mood swings.  Denies any vaginal bleeding Has noticed more discomfort in left hip and leg esp at night Has gas and feels bloated at times, has hemorrhoids that bleeds at times   Physical Exam:BP 135/75 (BP Location: Left Arm, Patient Position: Sitting, Cuff Size: Normal)   Pulse 66   Ht 4' 11.5 (1.511 m)   Wt 128 lb 8 oz (58.3 kg)   LMP 09/21/2011   BMI 25.52 kg/m   General:  Well developed, well nourished, no acute distress Skin:  Warm and dry Neck:  Midline trachea, normal thyroid, good ROM, no lymphadenopathy,no carotid bruits heard Lungs; Clear to auscultation bilaterally Breast:  No dominant palpable mass, retraction, or nipple discharge Cardiovascular: Regular rate and rhythm Abdomen:  Soft, non tender, no hepatosplenomegaly Pelvic:  External genitalia is normal in appearance, no lesions.  The vagina is normal in  appearance. Urethra has no lesions or masses. The cervix is smooth.  Uterus is felt to be normal size, shape, and contour.  No adnexal masses or tenderness noted.Bladder is non tender, no masses felt. Rectal: deferred has external hemorrhoids Extremities/musculoskeletal:  No swelling or varicosities noted, no clubbing or cyanosis Psych:  No mood changes, alert and cooperative,seems happy AA is 3 Fall risk is low    08/16/2023    9:31 AM 08/11/2022    8:37 AM 07/28/2021    8:51 AM  Depression screen PHQ 2/9  Decreased Interest 0 0 0  Down, Depressed, Hopeless 0 0 1  PHQ - 2 Score 0 0 1  Altered sleeping 1 0 1  Tired, decreased energy 0 1 1  Change in appetite 1 1 0  Feeling bad or failure about yourself  0 0 0  Trouble concentrating 1 0 0  Moving slowly or fidgety/restless 0 0 0  Suicidal thoughts 0 0 0  PHQ-9 Score 3 2 3        08/16/2023    9:32 AM 08/11/2022    8:37 AM 07/28/2021    8:51 AM 06/03/2020    8:36 AM  GAD 7 : Generalized Anxiety Score  Nervous, Anxious, on Edge 1 1 1  0  Control/stop worrying 0 0 0 0  Worry too much - different things 1 1 0 0  Trouble relaxing 0 0 0 0  Restless 0 0 0 0  Easily annoyed or irritable 0 0 0 0  Afraid - awful might happen 0 1 0 0  Total GAD 7 Score 2 3 1  0    Upstream - 08/16/23 9062       Pregnancy Intention Screening   Does the patient want to become pregnant in the next year? N/A    Does the patient's partner want to become pregnant in the next year? N/A    Would the patient like to discuss contraceptive options today? N/A      Contraception Wrap Up   Current Method Female Sterilization   PM   End Method Female Sterilization   PM   Contraception Counseling Provided No         Examination chaperoned by Clarita Salt LPN     Impression and plan:  1. Encounter for well woman exam with routine gynecological exam (Primary) Pap and physical in 1 year Labs with PCP Mammogram was negative 06/02/23 Colonoscopy per GI   2.  Hemorrhoids, unspecified hemorrhoid type Try peri bottle with warm water, TUCKS pads and anusol  cream    3. Postmenopausal Try stretches and chair yoga for body aches See handout    4. Elevated  BP without diagnosis of hypertension Stay active, follow up with PCP

## 2023-11-09 DIAGNOSIS — R7309 Other abnormal glucose: Secondary | ICD-10-CM | POA: Diagnosis not present

## 2023-11-09 DIAGNOSIS — Z0000 Encounter for general adult medical examination without abnormal findings: Secondary | ICD-10-CM | POA: Diagnosis not present

## 2023-11-09 DIAGNOSIS — Z23 Encounter for immunization: Secondary | ICD-10-CM | POA: Diagnosis not present

## 2023-11-09 DIAGNOSIS — E785 Hyperlipidemia, unspecified: Secondary | ICD-10-CM | POA: Diagnosis not present
